# Patient Record
Sex: Male | Born: 1950 | Race: White | Hispanic: No | Marital: Married | Smoking: Former smoker
Health system: Southern US, Community
[De-identification: ages and names within clinical notes are randomized; demographics above are authoritative.]

## PROBLEM LIST (undated history)

## (undated) DIAGNOSIS — K5790 Diverticulosis of intestine, part unspecified, without perforation or abscess without bleeding: Secondary | ICD-10-CM

## (undated) DIAGNOSIS — M109 Gout, unspecified: Secondary | ICD-10-CM

## (undated) DIAGNOSIS — K219 Gastro-esophageal reflux disease without esophagitis: Secondary | ICD-10-CM

## (undated) DIAGNOSIS — E785 Hyperlipidemia, unspecified: Secondary | ICD-10-CM

## (undated) DIAGNOSIS — C61 Malignant neoplasm of prostate: Secondary | ICD-10-CM

## (undated) DIAGNOSIS — K279 Peptic ulcer, site unspecified, unspecified as acute or chronic, without hemorrhage or perforation: Secondary | ICD-10-CM

## (undated) DIAGNOSIS — J439 Emphysema, unspecified: Secondary | ICD-10-CM

## (undated) DIAGNOSIS — R7303 Prediabetes: Secondary | ICD-10-CM

## (undated) HISTORY — PX: TONSILLECTOMY: SUR1361

## (undated) HISTORY — PX: HERNIA REPAIR: SHX51

## (undated) HISTORY — PX: COLONOSCOPY: SHX174

---

## 2003-03-08 ENCOUNTER — Emergency Department (HOSPITAL_COMMUNITY): Admission: AD | Admit: 2003-03-08 | Discharge: 2003-03-08 | Payer: Self-pay | Admitting: Family Medicine

## 2006-02-17 DIAGNOSIS — K635 Polyp of colon: Secondary | ICD-10-CM

## 2006-02-17 HISTORY — DX: Polyp of colon: K63.5

## 2006-05-07 ENCOUNTER — Ambulatory Visit: Payer: Self-pay | Admitting: Gastroenterology

## 2009-07-13 ENCOUNTER — Ambulatory Visit: Payer: Self-pay | Admitting: Gastroenterology

## 2014-02-17 DIAGNOSIS — D126 Benign neoplasm of colon, unspecified: Secondary | ICD-10-CM

## 2014-02-17 HISTORY — DX: Benign neoplasm of colon, unspecified: D12.6

## 2014-08-07 ENCOUNTER — Ambulatory Visit (INDEPENDENT_AMBULATORY_CARE_PROVIDER_SITE_OTHER): Payer: BLUE CROSS/BLUE SHIELD | Admitting: Podiatry

## 2014-08-07 ENCOUNTER — Ambulatory Visit (INDEPENDENT_AMBULATORY_CARE_PROVIDER_SITE_OTHER): Payer: BLUE CROSS/BLUE SHIELD

## 2014-08-07 ENCOUNTER — Encounter: Payer: Self-pay | Admitting: Podiatry

## 2014-08-07 VITALS — BP 151/95 | HR 80 | Resp 16

## 2014-08-07 DIAGNOSIS — M722 Plantar fascial fibromatosis: Secondary | ICD-10-CM

## 2014-08-07 DIAGNOSIS — M79673 Pain in unspecified foot: Secondary | ICD-10-CM

## 2014-08-07 MED ORDER — MELOXICAM 15 MG PO TABS: 15.0000 mg | ORAL_TABLET | Freq: Every day | ORAL | Status: DC

## 2014-08-07 NOTE — Patient Instructions (Signed)

## 2014-08-07 NOTE — Progress Notes (Signed)
   Subjective:    Patient ID: David David, male    DOB: 10-10-1950, 64 y.o.   MRN: [id number]  HPI Comments: "My right heel is sore"  Patient c/o tender plantar right heel for 6 months. AM pain and painful when up in middle of the night. His wife has had some orthotics made by our office and recommended he get some as well. No treatment at home.     Review of Systems  All other systems reviewed and are negative.      Objective:   Physical Exam Physical Exam: I have reviewed her past history medications allergies surgery social history and review systems. Pulses are strongly palpable bilateral. Neurologic sensorium is intact for Semmes-Weinstein monofilament. Deep tendon reflexes are intact bilaterally muscle strength +5 over 5 dorsiflexors plantar flexors and inverters everters all Jones musculature is intact. Orthopedic evaluation demonstrates all joints distal to the ankle for range of motion without crepitation. Cutaneous evaluation demonstrates supple well-hydrated cutis. She has pain on palpation medial calcaneal tubercle of the right heel. Radiographs confirm soft tissue increase in density at the plantar fascial calcaneal insertion site.       Assessment & Plan:  Assessment: Plantar fasciitis right  Plan: Discussed etiology pathology conservative versus surgical therapies. Discussed the appropriate shoe gear stretching exercises ice therapy issue modifications. Dispensed a prescription for meloxicam. Scanned for custom molded orthotics today. I will follow-up with him in 1 month.

## 2014-09-06 ENCOUNTER — Ambulatory Visit: Payer: BLUE CROSS/BLUE SHIELD | Admitting: Podiatry

## 2014-09-11 ENCOUNTER — Ambulatory Visit (INDEPENDENT_AMBULATORY_CARE_PROVIDER_SITE_OTHER): Payer: BLUE CROSS/BLUE SHIELD | Admitting: Podiatry

## 2014-09-11 DIAGNOSIS — M722 Plantar fascial fibromatosis: Secondary | ICD-10-CM

## 2014-09-11 NOTE — Progress Notes (Signed)
He presents today for follow-up of his plantar fasciitis. He states that his heels are doing 100% better. He is also here to pick up his orthotics today.  Orthotics were dispensed he was divided with both oral and written home-going instructions for the care and use of the orthotics we will follow-up with him in 1 month.

## 2014-09-11 NOTE — Patient Instructions (Signed)

## 2015-02-18 HISTORY — PX: OTHER SURGICAL HISTORY: SHX169

## 2015-07-10 ENCOUNTER — Emergency Department
Admission: EM | Admit: 2015-07-10 | Discharge: 2015-07-10 | Disposition: A | Discharge status: Home / Self Care | Payer: Managed Care, Other (non HMO) | Attending: Emergency Medicine | Admitting: Emergency Medicine

## 2015-07-10 DIAGNOSIS — Z5321 Procedure and treatment not carried out due to patient leaving prior to being seen by health care provider: Secondary | ICD-10-CM | POA: Insufficient documentation

## 2015-07-10 DIAGNOSIS — Z791 Long term (current) use of non-steroidal anti-inflammatories (NSAID): Secondary | ICD-10-CM | POA: Diagnosis not present

## 2015-07-10 DIAGNOSIS — R339 Retention of urine, unspecified: Secondary | ICD-10-CM | POA: Diagnosis not present

## 2015-07-10 NOTE — ED Notes (Signed)
Pt states he had a prostate biopsy today and has not been able to urinate since 1:45.Marland Kitchen

## 2015-07-10 NOTE — ED Notes (Signed)
Bladder scan 12 cc

## 2015-09-18 DIAGNOSIS — C61 Malignant neoplasm of prostate: Secondary | ICD-10-CM

## 2015-09-18 HISTORY — DX: Malignant neoplasm of prostate: C61

## 2015-12-10 ENCOUNTER — Ambulatory Visit: Payer: Managed Care, Other (non HMO) | Admitting: Physical Therapy

## 2015-12-19 ENCOUNTER — Encounter: Payer: Managed Care, Other (non HMO) | Admitting: Physical Therapy

## 2015-12-26 ENCOUNTER — Encounter: Payer: Managed Care, Other (non HMO) | Admitting: Physical Therapy

## 2016-01-02 ENCOUNTER — Encounter: Payer: Managed Care, Other (non HMO) | Admitting: Physical Therapy

## 2016-01-16 ENCOUNTER — Encounter: Payer: Managed Care, Other (non HMO) | Admitting: Physical Therapy

## 2016-01-30 ENCOUNTER — Encounter: Payer: Managed Care, Other (non HMO) | Admitting: Physical Therapy

## 2016-02-13 ENCOUNTER — Encounter: Payer: Managed Care, Other (non HMO) | Admitting: Physical Therapy

## 2016-07-02 ENCOUNTER — Other Ambulatory Visit: Payer: Self-pay | Admitting: Family Medicine

## 2016-07-02 DIAGNOSIS — Z87891 Personal history of nicotine dependence: Secondary | ICD-10-CM

## 2016-07-11 ENCOUNTER — Ambulatory Visit
Admission: RE | Admit: 2016-07-11 | Discharge: 2016-07-11 | Disposition: A | Discharge status: Home / Self Care | Payer: 59 | Source: Ambulatory Visit | Attending: Family Medicine | Admitting: Family Medicine

## 2016-07-11 DIAGNOSIS — M799 Soft tissue disorder, unspecified: Secondary | ICD-10-CM | POA: Insufficient documentation

## 2016-07-11 DIAGNOSIS — Z87891 Personal history of nicotine dependence: Secondary | ICD-10-CM | POA: Insufficient documentation

## 2016-07-17 ENCOUNTER — Other Ambulatory Visit: Payer: Self-pay | Admitting: Family Medicine

## 2016-07-17 DIAGNOSIS — R229 Localized swelling, mass and lump, unspecified: Principal | ICD-10-CM

## 2016-07-17 DIAGNOSIS — IMO0002 Reserved for concepts with insufficient information to code with codable children: Secondary | ICD-10-CM

## 2016-07-22 ENCOUNTER — Ambulatory Visit
Admission: RE | Admit: 2016-07-22 | Discharge: 2016-07-22 | Disposition: A | Discharge status: Home / Self Care | Payer: 59 | Source: Ambulatory Visit | Attending: Family Medicine | Admitting: Family Medicine

## 2016-07-22 DIAGNOSIS — K760 Fatty (change of) liver, not elsewhere classified: Secondary | ICD-10-CM | POA: Diagnosis not present

## 2016-07-22 DIAGNOSIS — N200 Calculus of kidney: Secondary | ICD-10-CM | POA: Insufficient documentation

## 2016-07-22 DIAGNOSIS — K5730 Diverticulosis of large intestine without perforation or abscess without bleeding: Secondary | ICD-10-CM | POA: Diagnosis not present

## 2016-07-22 DIAGNOSIS — R229 Localized swelling, mass and lump, unspecified: Secondary | ICD-10-CM | POA: Diagnosis present

## 2016-07-22 DIAGNOSIS — IMO0002 Reserved for concepts with insufficient information to code with codable children: Secondary | ICD-10-CM

## 2016-07-22 HISTORY — DX: Malignant neoplasm of prostate: C61

## 2016-07-22 MED ORDER — IOPAMIDOL (ISOVUE-300) INJECTION 61%
100.0000 mL | Freq: Once | INTRAVENOUS | Status: AC | PRN
  Administered 2016-07-22: 100 mL via INTRAVENOUS

## 2016-07-25 ENCOUNTER — Ambulatory Visit: Payer: Managed Care, Other (non HMO)

## 2016-10-23 DIAGNOSIS — C61 Malignant neoplasm of prostate: Secondary | ICD-10-CM | POA: Diagnosis not present

## 2016-12-03 DIAGNOSIS — H903 Sensorineural hearing loss, bilateral: Secondary | ICD-10-CM | POA: Diagnosis not present

## 2016-12-31 DIAGNOSIS — E785 Hyperlipidemia, unspecified: Secondary | ICD-10-CM | POA: Diagnosis not present

## 2016-12-31 DIAGNOSIS — R7303 Prediabetes: Secondary | ICD-10-CM | POA: Diagnosis not present

## 2017-01-07 DIAGNOSIS — K219 Gastro-esophageal reflux disease without esophagitis: Secondary | ICD-10-CM | POA: Diagnosis not present

## 2017-01-07 DIAGNOSIS — E785 Hyperlipidemia, unspecified: Secondary | ICD-10-CM | POA: Diagnosis not present

## 2017-01-07 DIAGNOSIS — R7303 Prediabetes: Secondary | ICD-10-CM | POA: Diagnosis not present

## 2017-01-29 ENCOUNTER — Other Ambulatory Visit: Payer: Self-pay

## 2017-01-29 DIAGNOSIS — C61 Malignant neoplasm of prostate: Secondary | ICD-10-CM

## 2017-02-11 ENCOUNTER — Telehealth: Payer: Self-pay | Admitting: Urology

## 2017-02-11 NOTE — Telephone Encounter (Signed)
Pt needs a refill on Sildenafil.  He would like this sent to [facility].  He is a pt of [facility] from [facility].  [phone]

## 2017-02-12 NOTE — Telephone Encounter (Signed)
Did not see in [facility] note when pt was supposed to return. Do you want pt to have refills?

## 2017-02-13 MED ORDER — SILDENAFIL CITRATE 20 MG PO TABS: ORAL_TABLET | ORAL | 0 refills | Status: DC

## 2017-02-13 NOTE — Telephone Encounter (Signed)
Lab appt and OV appts made.

## 2017-02-13 NOTE — Telephone Encounter (Signed)
He is past due for a psa- f/u based on psa results.  rx sent

## 2017-02-18 ENCOUNTER — Telehealth: Payer: Self-pay

## 2017-02-18 NOTE — Telephone Encounter (Signed)
David Mccoy from [facility] called stating pt has been getting sildenafil 20mg  90 tabs with directions of 1 tab daily. The most recent rx sent over was 10 tabs directions take 2-5 prior to intercourse. Which rx would you prefer pt to have? F/u appts have been made.

## 2017-02-19 MED ORDER — SILDENAFIL CITRATE 20 MG PO TABS: ORAL_TABLET | ORAL | 0 refills | Status: DC

## 2017-02-19 NOTE — Telephone Encounter (Signed)
Spoke with pt in reference to medication. Pt voiced understanding. 90 and no refills sent to pharmacy.

## 2017-02-19 NOTE — Telephone Encounter (Signed)
90 with no refills is fine

## 2017-03-18 ENCOUNTER — Other Ambulatory Visit: Payer: PPO

## 2017-03-18 DIAGNOSIS — C61 Malignant neoplasm of prostate: Secondary | ICD-10-CM

## 2017-03-19 LAB — PSA: Prostate Specific Ag, Serum: <0.1 ng/mL (ref 0.0–4.0)

## 2017-03-20 ENCOUNTER — Telehealth: Payer: Self-pay

## 2017-03-20 NOTE — Telephone Encounter (Signed)
-----   Message from Abbie Sons, MD sent at 03/20/2017  8:01 AM EST ----- PSA remains undetectable at <0.1

## 2017-03-20 NOTE — Telephone Encounter (Signed)
Letter sent.

## 2017-03-25 ENCOUNTER — Ambulatory Visit (INDEPENDENT_AMBULATORY_CARE_PROVIDER_SITE_OTHER): Payer: PPO | Admitting: Urology

## 2017-03-25 ENCOUNTER — Encounter: Payer: Self-pay | Admitting: Urology

## 2017-03-25 VITALS — BP 159/98 | HR 81 | Ht 69.0 in | Wt 187.8 lb

## 2017-03-25 DIAGNOSIS — C61 Malignant neoplasm of prostate: Secondary | ICD-10-CM | POA: Insufficient documentation

## 2017-03-25 DIAGNOSIS — N393 Stress incontinence (female) (male): Secondary | ICD-10-CM | POA: Insufficient documentation

## 2017-03-25 DIAGNOSIS — N5231 Erectile dysfunction following radical prostatectomy: Secondary | ICD-10-CM | POA: Diagnosis not present

## 2017-03-25 NOTE — Progress Notes (Signed)
03/25/2017 10:09 AM   David Mccoy February 23, 1950 [id number]  Referring provider: Dion Body, MD [facility] [place],  [id number]  Chief Complaint  Patient presents with  . Prostate Cancer    HPI: 67 year old male presents for follow-up of prostate cancer.  He is status post RALP August 2017 for Gleason 3+4 adenocarcinoma.  Final pathology showed pT2c N0 Mx adenocarcinoma with a positive unifocal apical margin.  Options of adjuvant versus salvage radiotherapy were discussed and he elected the latter.  I last saw him at [facility] in March 2018.  A PSA drawn on 03/18/2017 remains undetectable at <0.1.  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  He has tried PDE 5 inhibitors, intracavernosal injections and vacuum erection device.  He states the intracavernosal injections were very painful and unable to tolerate.  He has noted some upward curvature to his penis.  He has mild stress incontinence and changes a pad 1 time per day.  He did undergo the pelvic floor PT however has stopped doing exercises.    PMH: Past Medical History:  Diagnosis Date  . Prostate CA [facility]) 09/2015    Surgical History: Past Surgical History:  Procedure Laterality Date  . TONSILLECTOMY      Home Medications:  Allergies as of 03/25/2017   No Known Allergies     Medication List        Accurate as of 03/25/17 10:09 AM. Always use your most recent med list.          CALCIUM 1000 + D PO Take by mouth.   cholecalciferol 1000 units tablet Commonly known as:  VITAMIN D Take 1,000 Units by mouth daily.   lansoprazole 15 MG capsule Commonly known as:  PREVACID Take 15 mg by mouth daily at 12 noon.   multivitamin tablet Take 1 tablet by mouth daily.   omega-3 acid ethyl esters 1 g capsule Commonly known as:  LOVAZA Take by mouth 2 (two) times daily.   sildenafil 20 MG tablet Commonly known as:  REVATIO One tablet daily   simvastatin 20 MG  tablet Commonly known as:  ZOCOR Take 20 mg by mouth daily.       Allergies: No Known Allergies  Family History: Family History  Problem Relation Age of Onset  . Bladder Cancer Neg Hx   . Kidney cancer Neg Hx   . Prostate cancer Neg Hx     Social History:  reports that  has never smoked. He has quit using smokeless tobacco. He reports that he drinks alcohol. He reports that he does not use drugs.  ROS: UROLOGY Frequent Urination?: Yes Hard to postpone urination?: No Burning/pain with urination?: No Get up at night to urinate?: No Leakage of urine?: Yes Urine stream starts and stops?: No Trouble starting stream?: No Do you have to strain to urinate?: No Blood in urine?: No Urinary tract infection?: No Sexually transmitted disease?: No Injury to kidneys or bladder?: No Painful intercourse?: No Weak stream?: No Erection problems?: No Penile pain?: No  Gastrointestinal Nausea?: No Vomiting?: No Indigestion/heartburn?: No Diarrhea?: No Constipation?: No  Constitutional Fever: No Night sweats?: No Weight loss?: No Fatigue?: No  Skin Skin rash/lesions?: No Itching?: No  Eyes Blurred vision?: No Double vision?: No  Ears/Nose/Throat Sore throat?: No Sinus problems?: No  Hematologic/Lymphatic Swollen glands?: No Easy bruising?: No  Cardiovascular Leg swelling?: No Chest pain?: No  Respiratory Cough?: No Shortness of breath?: No  Endocrine Excessive thirst?: No  Musculoskeletal Back pain?:  No Joint pain?: No  Neurological Headaches?: No Dizziness?: No  Psychologic Depression?: No Anxiety?: No  Physical Exam: BP (!) 159/98 (BP Location: Right Arm, Patient Position: Sitting, Cuff Size: Normal)   Pulse 81   Ht 5\' 9"  (1.753 m)   Wt 187 lb 12.8 oz (85.2 kg)   BMI 27.73 kg/m   Constitutional:  Alert and oriented, No acute distress. HEENT: [place] AT, moist mucus membranes.  Trachea midline, no masses. Cardiovascular: No clubbing, cyanosis,  or edema. Respiratory: Normal respiratory effort, no increased work of breathing. Neurologic: Grossly intact, no focal deficits, moving all 4 extremities. Psychiatric: Normal mood and affect.  Laboratory Data:  Lab Results  Component Value Date   PSA1 <0.1 03/18/2017     Assessment & Plan: pT2c adenocarcinoma.  His PSA remains undetectable.  Discussed penile prosthetic surgery for his ED.  He would like to think this over and if he desires to pursue will call back.  He will restart his pelvic floor exercises for stress incontinence.  Recommend a follow-up PSA in 6 months and an office visit/PSA 1 year.    Abbie Sons, [place] [address] [place], [place] [address] [phone]

## 2017-06-25 DIAGNOSIS — E785 Hyperlipidemia, unspecified: Secondary | ICD-10-CM | POA: Diagnosis not present

## 2017-06-25 DIAGNOSIS — K219 Gastro-esophageal reflux disease without esophagitis: Secondary | ICD-10-CM | POA: Diagnosis not present

## 2017-06-25 DIAGNOSIS — R7303 Prediabetes: Secondary | ICD-10-CM | POA: Diagnosis not present

## 2017-07-09 DIAGNOSIS — E785 Hyperlipidemia, unspecified: Secondary | ICD-10-CM | POA: Diagnosis not present

## 2017-07-09 DIAGNOSIS — R7303 Prediabetes: Secondary | ICD-10-CM | POA: Diagnosis not present

## 2017-07-09 DIAGNOSIS — Z0000 Encounter for general adult medical examination without abnormal findings: Secondary | ICD-10-CM | POA: Diagnosis not present

## 2017-09-30 ENCOUNTER — Other Ambulatory Visit: Payer: PPO

## 2017-09-30 DIAGNOSIS — C61 Malignant neoplasm of prostate: Secondary | ICD-10-CM

## 2017-10-01 LAB — PSA: Prostate Specific Ag, Serum: <0.1 ng/mL (ref 0.0–4.0)

## 2017-12-07 DIAGNOSIS — L718 Other rosacea: Secondary | ICD-10-CM | POA: Diagnosis not present

## 2017-12-07 DIAGNOSIS — L218 Other seborrheic dermatitis: Secondary | ICD-10-CM | POA: Diagnosis not present

## 2017-12-07 DIAGNOSIS — D2262 Melanocytic nevi of left upper limb, including shoulder: Secondary | ICD-10-CM | POA: Diagnosis not present

## 2017-12-07 DIAGNOSIS — D2261 Melanocytic nevi of right upper limb, including shoulder: Secondary | ICD-10-CM | POA: Diagnosis not present

## 2017-12-07 DIAGNOSIS — L821 Other seborrheic keratosis: Secondary | ICD-10-CM | POA: Diagnosis not present

## 2017-12-07 DIAGNOSIS — D2272 Melanocytic nevi of left lower limb, including hip: Secondary | ICD-10-CM | POA: Diagnosis not present

## 2017-12-07 DIAGNOSIS — D2271 Melanocytic nevi of right lower limb, including hip: Secondary | ICD-10-CM | POA: Diagnosis not present

## 2017-12-07 DIAGNOSIS — D225 Melanocytic nevi of trunk: Secondary | ICD-10-CM | POA: Diagnosis not present

## 2018-01-13 DIAGNOSIS — E785 Hyperlipidemia, unspecified: Secondary | ICD-10-CM | POA: Diagnosis not present

## 2018-01-13 DIAGNOSIS — R7303 Prediabetes: Secondary | ICD-10-CM | POA: Diagnosis not present

## 2018-01-20 DIAGNOSIS — K219 Gastro-esophageal reflux disease without esophagitis: Secondary | ICD-10-CM | POA: Diagnosis not present

## 2018-01-20 DIAGNOSIS — E782 Mixed hyperlipidemia: Secondary | ICD-10-CM | POA: Diagnosis not present

## 2018-01-20 DIAGNOSIS — R7303 Prediabetes: Secondary | ICD-10-CM | POA: Diagnosis not present

## 2018-03-23 ENCOUNTER — Other Ambulatory Visit: Payer: Self-pay

## 2018-03-23 DIAGNOSIS — C61 Malignant neoplasm of prostate: Secondary | ICD-10-CM

## 2018-03-24 ENCOUNTER — Other Ambulatory Visit: Payer: PPO

## 2018-03-24 DIAGNOSIS — C61 Malignant neoplasm of prostate: Secondary | ICD-10-CM | POA: Diagnosis not present

## 2018-03-25 LAB — PSA: Prostate Specific Ag, Serum: <0.1 ng/mL (ref 0.0–4.0)

## 2018-03-31 ENCOUNTER — Encounter: Payer: Self-pay | Admitting: Urology

## 2018-03-31 ENCOUNTER — Ambulatory Visit: Payer: PPO | Admitting: Urology

## 2018-03-31 VITALS — BP 146/82 | HR 88 | Ht 68.0 in | Wt 180.0 lb

## 2018-03-31 DIAGNOSIS — C61 Malignant neoplasm of prostate: Secondary | ICD-10-CM

## 2018-03-31 MED ORDER — MIRABEGRON ER 50 MG PO TB24: 50.0000 mg | ORAL_TABLET | Freq: Every day | ORAL | 11 refills | Status: DC

## 2018-03-31 NOTE — Progress Notes (Signed)
03/31/2018 9:45 AM   David Mccoy [birth year] [id number]  Referring provider: Dion Body, MD [facility] [place], [place] [address]  Chief Complaint  Patient presents with  . Follow-up   Urologic history: 1. pT2c N0 M0 adenocarcinoma prostate  - RALP at [facility] 09/2015  -Gleason 3+4 with positive unifocal apical margin  -Declined adjuvant radiation  -PSA has remained undetectable   HPI: 68 year old male presents for annual follow-up prostate cancer.  He does have urinary frequency which has been present since his surgery.  He did undergo pelvic floor physical therapy which did not improve his symptoms.  Denies urinary incontinence, dysuria or gross hematuria.  PSA 03/24/2018 remains undetectable at <0.1.   PMH: Past Medical History:  Diagnosis Date  . Prostate CA [facility]) 09/2015    Surgical History: Past Surgical History:  Procedure Laterality Date  . TONSILLECTOMY      Home Medications:  Allergies as of 03/31/2018   No Known Allergies     Medication List       Accurate as of March 31, 2018  9:45 AM. Always use your most recent med list.        CALCIUM 1000 + D PO Take by mouth.   cholecalciferol 1000 units tablet Commonly known as:  VITAMIN D Take 1,000 Units by mouth daily.   lansoprazole 15 MG capsule Commonly known as:  PREVACID Take 15 mg by mouth daily at 12 noon.   multivitamin tablet Take 1 tablet by mouth daily.   omega-3 acid ethyl esters 1 g capsule Commonly known as:  LOVAZA Take by mouth 2 (two) times daily.   sildenafil 20 MG tablet Commonly known as:  REVATIO One tablet daily   simvastatin 20 MG tablet Commonly known as:  ZOCOR Take 20 mg by mouth daily.       Allergies: No Known Allergies  Family History: Family History  Problem Relation Age of Onset  . Bladder Cancer Neg Hx   . Kidney cancer Neg Hx   . Prostate cancer Neg Hx     Social History:  reports that he has never  smoked. He has quit using smokeless tobacco. He reports current alcohol use. He reports that he does not use drugs.  ROS: UROLOGY Frequent Urination?: Yes Hard to postpone urination?: No Burning/pain with urination?: No Get up at night to urinate?: Yes Leakage of urine?: Yes Urine stream starts and stops?: No Trouble starting stream?: No Blood in urine?: No Urinary tract infection?: No Sexually transmitted disease?: No Injury to kidneys or bladder?: No Painful intercourse?: No Weak stream?: No Erection problems?: Yes Penile pain?: No  Gastrointestinal Nausea?: No Vomiting?: No Indigestion/heartburn?: No Diarrhea?: No Constipation?: No  Constitutional Fever: No Night sweats?: No Weight loss?: No Fatigue?: No  Skin Skin rash/lesions?: No Itching?: No  Eyes Blurred vision?: No Double vision?: No  Ears/Nose/Throat Sore throat?: No Sinus problems?: No  Hematologic/Lymphatic Swollen glands?: No Easy bruising?: No  Cardiovascular Leg swelling?: No Chest pain?: No  Respiratory Cough?: No Shortness of breath?: No  Endocrine Excessive thirst?: No  Musculoskeletal Back pain?: No Joint pain?: No  Neurological Headaches?: No Dizziness?: No  Psychologic Depression?: No Anxiety?: No  Physical Exam: BP (!) 146/82   Pulse 88   Ht 5\' 8"  (1.727 m)   Wt 180 lb (81.6 kg)   BMI 27.37 kg/m   Constitutional:  Alert and oriented, No acute distress. HEENT: [place] AT, moist mucus membranes.  Trachea midline, no masses. Cardiovascular:  No clubbing, cyanosis, or edema. Respiratory: Normal respiratory effort, no increased work of breathing. GI: Abdomen is soft, nontender, nondistended, no abdominal masses GU: No CVA tenderness Lymph: No cervical or inguinal lymphadenopathy. Skin: No rashes, bruises or suspicious lesions. Neurologic: Grossly intact, no focal deficits, moving all 4 extremities. Psychiatric: Normal mood and affect.  Assessment & Plan:     68 year old male status post RALP for prostate cancer.  Currently doing well and PSA remains undetectable.  His urinary frequency is bothersome and he was interested in a trial of medical management.  He was given Myrbetriq samples 50 mg and an Rx was sent to his pharmacy in the event it needs prior authorization.  Follow-up 6 months lab visit/PSA and 1 year office visit/PSA.  Return in about 1 year (around 04/01/2019) for Recheck, PSA.   Abbie Sons, [place] [address] [place], [place] [address] [phone]

## 2018-04-01 ENCOUNTER — Encounter: Payer: Self-pay | Admitting: Urology

## 2018-04-08 ENCOUNTER — Encounter: Payer: Self-pay | Admitting: Urology

## 2018-04-22 ENCOUNTER — Other Ambulatory Visit: Payer: Self-pay

## 2018-04-22 ENCOUNTER — Encounter: Payer: Self-pay | Admitting: Urology

## 2018-04-22 DIAGNOSIS — N393 Stress incontinence (female) (male): Secondary | ICD-10-CM

## 2018-04-22 MED ORDER — MIRABEGRON ER 50 MG PO TB24: 50.0000 mg | ORAL_TABLET | Freq: Every day | ORAL | 11 refills | Status: DC

## 2018-07-21 DIAGNOSIS — R7303 Prediabetes: Secondary | ICD-10-CM | POA: Diagnosis not present

## 2018-07-21 DIAGNOSIS — E782 Mixed hyperlipidemia: Secondary | ICD-10-CM | POA: Diagnosis not present

## 2018-07-28 DIAGNOSIS — R7303 Prediabetes: Secondary | ICD-10-CM | POA: Diagnosis not present

## 2018-07-28 DIAGNOSIS — E782 Mixed hyperlipidemia: Secondary | ICD-10-CM | POA: Diagnosis not present

## 2018-07-28 DIAGNOSIS — Z0000 Encounter for general adult medical examination without abnormal findings: Secondary | ICD-10-CM | POA: Diagnosis not present

## 2018-08-12 DIAGNOSIS — R7303 Prediabetes: Secondary | ICD-10-CM | POA: Diagnosis not present

## 2018-08-12 DIAGNOSIS — S86112A Strain of other muscle(s) and tendon(s) of posterior muscle group at lower leg level, left leg, initial encounter: Secondary | ICD-10-CM | POA: Diagnosis not present

## 2018-09-02 DIAGNOSIS — K1329 Other disturbances of oral epithelium, including tongue: Secondary | ICD-10-CM | POA: Diagnosis not present

## 2018-09-13 DIAGNOSIS — K1329 Other disturbances of oral epithelium, including tongue: Secondary | ICD-10-CM | POA: Diagnosis not present

## 2018-09-28 ENCOUNTER — Other Ambulatory Visit: Payer: Self-pay | Admitting: Family Medicine

## 2018-09-28 DIAGNOSIS — C61 Malignant neoplasm of prostate: Secondary | ICD-10-CM

## 2018-09-29 ENCOUNTER — Other Ambulatory Visit: Payer: Self-pay

## 2018-09-29 ENCOUNTER — Other Ambulatory Visit: Payer: PPO

## 2018-09-29 DIAGNOSIS — C61 Malignant neoplasm of prostate: Secondary | ICD-10-CM | POA: Diagnosis not present

## 2018-09-30 LAB — PSA: Prostate Specific Ag, Serum: <0.1 ng/mL (ref 0.0–4.0)

## 2018-10-05 ENCOUNTER — Telehealth: Payer: Self-pay

## 2018-10-05 NOTE — Telephone Encounter (Signed)
-----   Message from Abbie Sons, MD sent at 10/04/2018  3:21 PM EDT ----- PSA remains undetectable at <0.1.  Follow-up as scheduled

## 2018-10-05 NOTE — Telephone Encounter (Signed)
mychart sent.

## 2018-11-12 ENCOUNTER — Encounter: Payer: Self-pay | Admitting: Urology

## 2018-11-17 ENCOUNTER — Encounter: Payer: Self-pay | Admitting: Urology

## 2018-12-06 DIAGNOSIS — L218 Other seborrheic dermatitis: Secondary | ICD-10-CM | POA: Diagnosis not present

## 2018-12-06 DIAGNOSIS — D2261 Melanocytic nevi of right upper limb, including shoulder: Secondary | ICD-10-CM | POA: Diagnosis not present

## 2018-12-06 DIAGNOSIS — D2262 Melanocytic nevi of left upper limb, including shoulder: Secondary | ICD-10-CM | POA: Diagnosis not present

## 2018-12-06 DIAGNOSIS — L570 Actinic keratosis: Secondary | ICD-10-CM | POA: Diagnosis not present

## 2018-12-06 DIAGNOSIS — L821 Other seborrheic keratosis: Secondary | ICD-10-CM | POA: Diagnosis not present

## 2018-12-06 DIAGNOSIS — X32XXXA Exposure to sunlight, initial encounter: Secondary | ICD-10-CM | POA: Diagnosis not present

## 2018-12-06 DIAGNOSIS — D2271 Melanocytic nevi of right lower limb, including hip: Secondary | ICD-10-CM | POA: Diagnosis not present

## 2018-12-06 DIAGNOSIS — D2272 Melanocytic nevi of left lower limb, including hip: Secondary | ICD-10-CM | POA: Diagnosis not present

## 2018-12-06 DIAGNOSIS — D225 Melanocytic nevi of trunk: Secondary | ICD-10-CM | POA: Diagnosis not present

## 2019-01-25 DIAGNOSIS — E782 Mixed hyperlipidemia: Secondary | ICD-10-CM | POA: Diagnosis not present

## 2019-01-25 DIAGNOSIS — R7303 Prediabetes: Secondary | ICD-10-CM | POA: Diagnosis not present

## 2019-02-01 DIAGNOSIS — E782 Mixed hyperlipidemia: Secondary | ICD-10-CM | POA: Diagnosis not present

## 2019-02-01 DIAGNOSIS — R7303 Prediabetes: Secondary | ICD-10-CM | POA: Diagnosis not present

## 2019-02-01 DIAGNOSIS — R030 Elevated blood-pressure reading, without diagnosis of hypertension: Secondary | ICD-10-CM | POA: Diagnosis not present

## 2019-02-01 DIAGNOSIS — K219 Gastro-esophageal reflux disease without esophagitis: Secondary | ICD-10-CM | POA: Diagnosis not present

## 2019-03-21 ENCOUNTER — Other Ambulatory Visit: Payer: Self-pay

## 2019-03-21 DIAGNOSIS — C61 Malignant neoplasm of prostate: Secondary | ICD-10-CM

## 2019-03-22 ENCOUNTER — Other Ambulatory Visit: Payer: PPO

## 2019-03-22 ENCOUNTER — Other Ambulatory Visit: Payer: Self-pay

## 2019-03-22 DIAGNOSIS — C61 Malignant neoplasm of prostate: Secondary | ICD-10-CM

## 2019-03-23 ENCOUNTER — Encounter: Payer: Self-pay | Admitting: Urology

## 2019-03-23 LAB — PSA: Prostate Specific Ag, Serum: <0.1 ng/mL (ref 0.0–4.0)

## 2019-03-29 ENCOUNTER — Ambulatory Visit: Payer: PPO | Admitting: Urology

## 2019-03-30 ENCOUNTER — Ambulatory Visit: Payer: PPO | Admitting: Urology

## 2019-04-01 ENCOUNTER — Ambulatory Visit: Payer: 59

## 2019-07-26 DIAGNOSIS — K219 Gastro-esophageal reflux disease without esophagitis: Secondary | ICD-10-CM | POA: Diagnosis not present

## 2019-07-26 DIAGNOSIS — E782 Mixed hyperlipidemia: Secondary | ICD-10-CM | POA: Diagnosis not present

## 2019-07-26 DIAGNOSIS — R7303 Prediabetes: Secondary | ICD-10-CM | POA: Diagnosis not present

## 2019-08-02 DIAGNOSIS — E782 Mixed hyperlipidemia: Secondary | ICD-10-CM | POA: Diagnosis not present

## 2019-08-02 DIAGNOSIS — R7303 Prediabetes: Secondary | ICD-10-CM | POA: Diagnosis not present

## 2019-08-02 DIAGNOSIS — Z0000 Encounter for general adult medical examination without abnormal findings: Secondary | ICD-10-CM | POA: Diagnosis not present

## 2019-08-02 DIAGNOSIS — Z1159 Encounter for screening for other viral diseases: Secondary | ICD-10-CM | POA: Diagnosis not present

## 2019-08-05 ENCOUNTER — Encounter: Payer: Self-pay | Admitting: *Deleted

## 2019-08-05 ENCOUNTER — Telehealth: Payer: Self-pay | Admitting: *Deleted

## 2019-08-05 DIAGNOSIS — Z87891 Personal history of nicotine dependence: Secondary | ICD-10-CM

## 2019-08-05 DIAGNOSIS — Z122 Encounter for screening for malignant neoplasm of respiratory organs: Secondary | ICD-10-CM

## 2019-08-05 NOTE — Addendum Note (Signed)
Addended by: Lieutenant Diego on: 08/05/2019 02:53 PM   Modules accepted: Orders

## 2019-08-05 NOTE — Telephone Encounter (Signed)
Received referral for low dose lung cancer screening CT scan. Message left at phone number listed in EMR for patient to call me back to facilitate scheduling scan.  

## 2019-08-05 NOTE — Telephone Encounter (Signed)
Received referral for initial lung cancer screening scan. Contacted patient and obtained smoking history,(former, quit 2008, 37 pack year) as well as answering questions related to screening process. Patient denies signs of lung cancer such as weight loss or hemoptysis. Patient denies comorbidity that would prevent curative treatment if lung cancer were found. Patient is scheduled for CT 08/15/19 at 8am.

## 2019-08-12 ENCOUNTER — Encounter: Payer: Self-pay | Admitting: Oncology

## 2019-08-12 ENCOUNTER — Inpatient Hospital Stay (HOSPITAL_BASED_OUTPATIENT_CLINIC_OR_DEPARTMENT_OTHER): Payer: PPO | Admitting: Oncology

## 2019-08-12 DIAGNOSIS — Z87891 Personal history of nicotine dependence: Secondary | ICD-10-CM | POA: Diagnosis not present

## 2019-08-12 NOTE — Progress Notes (Signed)
Virtual Visit via Video Note  I connected with David Mccoy  on 08/12/19 at  8:30 AM EDT by a video enabled telemedicine application and verified that I am speaking with the correct person using two identifiers.  Location: Patient: Home Provider: Clinic    I discussed the limitations of evaluation and management by telemedicine and the availability of in person appointments. The patient expressed understanding and agreed to proceed.  I discussed the assessment and treatment plan with the patient. The patient was provided an opportunity to ask questions and all were answered. The patient agreed with the plan and demonstrated an understanding of the instructions.   The patient was advised to call back or seek an in-person evaluation if the symptoms worsen or if the condition fails to improve as anticipated.   In accordance with CMS guidelines, patient has met eligibility criteria including age, absence of signs or symptoms of lung cancer.  Social History   Tobacco Use  . Smoking status: Never Smoker  . Smokeless tobacco: Former Network engineer  . Vaping Use: Never used  Substance Use Topics  . Alcohol use: Yes    Alcohol/week: 0.0 standard drinks  . Drug use: No      A shared decision-making session was conducted prior to the performance of CT scan. This includes one or more decision aids, includes benefits and harms of screening, follow-up diagnostic testing, over-diagnosis, false positive rate, and total radiation exposure.   Counseling on the importance of adherence to annual lung cancer LDCT screening, impact of co-morbidities, and ability or willingness to undergo diagnosis and treatment is imperative for compliance of the program.   Counseling on the importance of continued smoking cessation for former smokers; the importance of smoking cessation for current smokers, and information about tobacco cessation interventions have been given to patient including [place]  and 1800 quit [place] programs.   Written order for lung cancer screening with LDCT has been given to the patient and any and all questions have been answered to the best of my abilities.    Yearly follow up will be coordinated by Burgess Estelle, Thoracic Navigator.  I provided 15 minutes of face-to-face video visit time during this encounter, and > 50% was spent counseling as documented under my assessment & plan.   Jacquelin Hawking, NP

## 2019-08-15 ENCOUNTER — Other Ambulatory Visit: Payer: Self-pay

## 2019-08-15 ENCOUNTER — Ambulatory Visit
Admission: RE | Admit: 2019-08-15 | Discharge: 2019-08-15 | Disposition: A | Discharge status: Home / Self Care | Payer: PPO | Source: Ambulatory Visit | Attending: Oncology | Admitting: Oncology

## 2019-08-15 DIAGNOSIS — Z122 Encounter for screening for malignant neoplasm of respiratory organs: Secondary | ICD-10-CM | POA: Diagnosis not present

## 2019-08-15 DIAGNOSIS — Z87891 Personal history of nicotine dependence: Secondary | ICD-10-CM | POA: Diagnosis not present

## 2019-08-18 ENCOUNTER — Encounter: Payer: Self-pay | Admitting: *Deleted

## 2019-08-23 DIAGNOSIS — I700 Atherosclerosis of aorta: Secondary | ICD-10-CM | POA: Diagnosis not present

## 2019-08-23 DIAGNOSIS — J438 Other emphysema: Secondary | ICD-10-CM | POA: Diagnosis not present

## 2019-09-22 ENCOUNTER — Ambulatory Visit: Payer: PPO | Admitting: Urology

## 2019-10-03 ENCOUNTER — Other Ambulatory Visit: Payer: Self-pay

## 2019-10-03 DIAGNOSIS — C61 Malignant neoplasm of prostate: Secondary | ICD-10-CM

## 2019-10-04 ENCOUNTER — Other Ambulatory Visit: Payer: Self-pay

## 2019-10-04 ENCOUNTER — Other Ambulatory Visit: Payer: PPO

## 2019-10-04 DIAGNOSIS — C61 Malignant neoplasm of prostate: Secondary | ICD-10-CM | POA: Diagnosis not present

## 2019-10-05 LAB — PSA: Prostate Specific Ag, Serum: <0.1 ng/mL (ref 0.0–4.0)

## 2019-10-06 ENCOUNTER — Ambulatory Visit: Payer: PPO | Admitting: Urology

## 2019-10-06 ENCOUNTER — Encounter: Payer: Self-pay | Admitting: Urology

## 2019-10-06 ENCOUNTER — Other Ambulatory Visit: Payer: Self-pay

## 2019-10-06 VITALS — BP 135/86 | HR 71 | Ht 68.0 in | Wt 180.0 lb

## 2019-10-06 DIAGNOSIS — R399 Unspecified symptoms and signs involving the genitourinary system: Secondary | ICD-10-CM | POA: Diagnosis not present

## 2019-10-06 DIAGNOSIS — C61 Malignant neoplasm of prostate: Secondary | ICD-10-CM

## 2019-10-06 MED ORDER — SOLIFENACIN SUCCINATE 10 MG PO TABS: 10.0000 mg | ORAL_TABLET | Freq: Every day | ORAL | 2 refills | Status: DC

## 2019-10-06 NOTE — Progress Notes (Signed)
10/06/2019 9:02 PM   David Mccoy 01-05-1951 [id number]  Referring provider: Dion Body, MD [facility] [place],  [place] [address]  Chief Complaint  Patient presents with  . Prostate Cancer    Urologic history: 1. pT2c N0 M0 adenocarcinoma prostate             - RALP at [facility] 09/2015             -Gleason 3+4 with positive unifocal apical margin             -Declined adjuvant radiation             -PSA has remained undetectable  HPI: 69 y.o. male presents for annual follow-up.   At visit last year complaining of increased urinary frequency which was bothersome.  Was given a trial of Myrbetriq which he took for 3-4 months and did not note significant improvement  He is also developed increased urinary urgency with occasional episodes urge incontinence  Notes intermittent spraying of his urinary stream but typically voids with a good stream  Denies dysuria or gross hematuria  Denies flank, abdominal or pelvic pain  PSA 10/04/2019 remains undetectable at <0.1   PMH: Past Medical History:  Diagnosis Date  . Prostate CA [facility]) 09/2015    Surgical History: Past Surgical History:  Procedure Laterality Date  . TONSILLECTOMY      Home Medications:  Allergies as of 10/06/2019   No Known Allergies     Medication List       Accurate as of October 06, 2019  9:02 PM. If you have any questions, ask your nurse or doctor.        STOP taking these medications   mirabegron ER 50 MG Tb24 tablet Commonly known as: MYRBETRIQ Stopped by: Abbie Sons, MD   sildenafil 20 MG tablet Commonly known as: REVATIO Stopped by: Abbie Sons, MD     TAKE these medications   aspirin 81 MG EC tablet Take by mouth.   CALCIUM 1000 + D PO Take by mouth.   cholecalciferol 1000 units tablet Commonly known as: VITAMIN D Take 1,000 Units by mouth daily.   lansoprazole 15 MG capsule Commonly known as: PREVACID Take 15 mg by  mouth daily at 12 noon.   multivitamin tablet Take 1 tablet by mouth daily.   omega-3 acid ethyl esters 1 g capsule Commonly known as: LOVAZA Take by mouth 2 (two) times daily.   simvastatin 20 MG tablet Commonly known as: ZOCOR Take 40 mg by mouth daily.       Allergies: No Known Allergies  Family History: Family History  Problem Relation Age of Onset  . Bladder Cancer Neg Hx   . Kidney cancer Neg Hx   . Prostate cancer Neg Hx     Social History:  reports that he has never smoked. He has quit using smokeless tobacco. He reports current alcohol use. He reports that he does not use drugs.   Physical Exam: BP 135/86   Pulse 71   Ht 5\' 8"  (1.727 m)   Wt 180 lb (81.6 kg)   BMI 27.37 kg/m   Constitutional:  Alert and oriented, No acute distress. HEENT: [place] AT, moist mucus membranes.  Trachea midline, no masses. Cardiovascular: No clubbing, cyanosis, or edema. Respiratory: Normal respiratory effort, no increased work of breathing. GU: Phallus circumcised, meatus slightly small Skin: No rashes, bruises or suspicious lesions. Neurologic: Grossly intact, no focal deficits, moving all 4  extremities. Psychiatric: Normal mood and affect.   Assessment & Plan:    1. Prostate cancer ([place])  PSA remains undetectable for years postop  2.  Lower urinary tract symptoms  Frequency and urgency most bothersome  Intermittent spraying of urinary stream, meatus slightly small  Discussed possibility of urethral stricture or bladder neck contracture though less likely this far out.  Meatus slight small also discussed meatal dilation  No improvement with Myrbetriq, he was interested in a trial of anticholinergic medication and Rx solifenacin sent to pharmacy  Instructed to call earlier for worsening voiding symptoms he will otherwise follow-up in 6 months.   Abbie Sons, [place] [address] [place], [place] [address] [phone]

## 2019-10-07 ENCOUNTER — Encounter: Payer: Self-pay | Admitting: Urology

## 2019-12-01 DIAGNOSIS — H43812 Vitreous degeneration, left eye: Secondary | ICD-10-CM | POA: Diagnosis not present

## 2019-12-05 DIAGNOSIS — L821 Other seborrheic keratosis: Secondary | ICD-10-CM | POA: Diagnosis not present

## 2019-12-05 DIAGNOSIS — X32XXXA Exposure to sunlight, initial encounter: Secondary | ICD-10-CM | POA: Diagnosis not present

## 2019-12-05 DIAGNOSIS — D2271 Melanocytic nevi of right lower limb, including hip: Secondary | ICD-10-CM | POA: Diagnosis not present

## 2019-12-05 DIAGNOSIS — D2262 Melanocytic nevi of left upper limb, including shoulder: Secondary | ICD-10-CM | POA: Diagnosis not present

## 2019-12-05 DIAGNOSIS — D225 Melanocytic nevi of trunk: Secondary | ICD-10-CM | POA: Diagnosis not present

## 2019-12-05 DIAGNOSIS — L728 Other follicular cysts of the skin and subcutaneous tissue: Secondary | ICD-10-CM | POA: Diagnosis not present

## 2019-12-05 DIAGNOSIS — D2272 Melanocytic nevi of left lower limb, including hip: Secondary | ICD-10-CM | POA: Diagnosis not present

## 2019-12-05 DIAGNOSIS — D2261 Melanocytic nevi of right upper limb, including shoulder: Secondary | ICD-10-CM | POA: Diagnosis not present

## 2019-12-05 DIAGNOSIS — L570 Actinic keratosis: Secondary | ICD-10-CM | POA: Diagnosis not present

## 2019-12-12 DIAGNOSIS — Z86010 Personal history of colonic polyps: Secondary | ICD-10-CM | POA: Diagnosis not present

## 2020-01-31 DIAGNOSIS — E782 Mixed hyperlipidemia: Secondary | ICD-10-CM | POA: Diagnosis not present

## 2020-01-31 DIAGNOSIS — R7303 Prediabetes: Secondary | ICD-10-CM | POA: Diagnosis not present

## 2020-02-07 DIAGNOSIS — I700 Atherosclerosis of aorta: Secondary | ICD-10-CM | POA: Diagnosis not present

## 2020-02-07 DIAGNOSIS — R7303 Prediabetes: Secondary | ICD-10-CM | POA: Diagnosis not present

## 2020-02-07 DIAGNOSIS — E782 Mixed hyperlipidemia: Secondary | ICD-10-CM | POA: Diagnosis not present

## 2020-02-07 DIAGNOSIS — K219 Gastro-esophageal reflux disease without esophagitis: Secondary | ICD-10-CM | POA: Diagnosis not present

## 2020-02-10 DIAGNOSIS — K4090 Unilateral inguinal hernia, without obstruction or gangrene, not specified as recurrent: Secondary | ICD-10-CM | POA: Diagnosis not present

## 2020-02-13 ENCOUNTER — Other Ambulatory Visit: Payer: Self-pay

## 2020-02-13 ENCOUNTER — Ambulatory Visit
Admission: RE | Admit: 2020-02-13 | Discharge: 2020-02-13 | Disposition: A | Discharge status: Home / Self Care | Payer: PPO | Source: Ambulatory Visit | Attending: General Surgery | Admitting: General Surgery

## 2020-02-13 ENCOUNTER — Other Ambulatory Visit: Payer: Self-pay | Admitting: General Surgery

## 2020-02-13 ENCOUNTER — Ambulatory Visit: Payer: Self-pay | Admitting: General Surgery

## 2020-02-13 DIAGNOSIS — K4090 Unilateral inguinal hernia, without obstruction or gangrene, not specified as recurrent: Secondary | ICD-10-CM | POA: Diagnosis not present

## 2020-02-13 DIAGNOSIS — R1032 Left lower quadrant pain: Secondary | ICD-10-CM | POA: Insufficient documentation

## 2020-02-13 DIAGNOSIS — R1904 Left lower quadrant abdominal swelling, mass and lump: Secondary | ICD-10-CM | POA: Diagnosis not present

## 2020-02-13 DIAGNOSIS — K5730 Diverticulosis of large intestine without perforation or abscess without bleeding: Secondary | ICD-10-CM | POA: Diagnosis not present

## 2020-02-13 MED ORDER — IOHEXOL 300 MG/ML  SOLN
100.0000 mL | Freq: Once | INTRAMUSCULAR | Status: AC | PRN
  Administered 2020-02-13: 100 mL via INTRAVENOUS

## 2020-02-13 NOTE — H&P (Signed)
PATIENT PROFILE: David Mccoy is a 69 y.o. male who presents to the Clinic for consultation at the request of Dr. Netty Mccoy for evaluation of inguinal hernia.  PCP:  David Body, MD  HISTORY OF PRESENT ILLNESS: David Mccoy reports having groin pain since a week ago.  The pain is localized to the left groin.  He also reported the pain radiates to the left testicle.  He described swelling.  Aggravating factor is heavy lifting.  Alleviating factor is resting.  Denies abdominal distention nausea or vomiting.  Report associated constipation.  Denies any history of left inguinal hernia repair.  Patient does endorses having robotic assisted prostatectomy with right inguinal hernia repair.   PROBLEM LIST:         Problem List  Date Reviewed: 02/07/2020         Noted   Aortic atherosclerosis (CT 08/15/19) 08/23/2019   Emphysema lung (CT 08/15/19) 08/23/2019   Medicare annual wellness visit, subsequent 08/02/19 02/01/2019   Medicare annual wellness visit, initial 07/09/17 07/09/2017   Gastroesophageal reflux disease without esophagitis 01/07/2017   Mixed hyperlipidemia (LDL 99, Trig 170 - 01/31/20) 06/20/2014   David Mccoy syndrome Unknown   History of gout Unknown   Borderline diabetes mellitus (A1c 6.1% - 01/31/20) - diet controlled Unknown   History of prostate cancer s/p robotic prostatectomy (09/24/15) per Dr. Edd Mcc[facility] at [facility] - followed by Dr. Elenor Mccoy Unknown   Overview    with negative biopsy 2015, followed by Dr. Bernardo Mccoy         GENERAL REVIEW OF SYSTEMS:   General ROS: negative for - chills, fatigue, fever, weight gain or weight loss Allergy and Immunology ROS: negative for - hives  Hematological and Lymphatic ROS: negative for - bleeding problems or bruising, negative for palpable nodes Endocrine ROS: negative for - heat or cold intolerance, hair changes Respiratory ROS: negative for - cough, shortness of breath or wheezing Cardiovascular ROS: no chest pain or  palpitations GI ROS: negative for nausea, vomiting, abdominal pain, diarrhea, positive for right groin and constipation Musculoskeletal ROS: negative for - joint swelling or muscle pain Neurological ROS: negative for - confusion, syncope Dermatological ROS: negative for pruritus and rash Psychiatric: negative for anxiety, depression, difficulty sleeping and memory loss  MEDICATIONS: Current Medications        Current Outpatient Medications  Medication Sig Dispense Refill  . aspirin 81 MG EC tablet Take 1 tablet (81 mg total) by mouth once daily 30 tablet 11  . calcium carbonate (CALCIUM 500 ORAL) Take 1,000 mg by mouth    . cholecalciferol (CHOLECALCIFEROL) 1,000 unit tablet Take 1,000 Units by mouth once daily.     . cyanocobalamin (VITAMIN B12) 1000 MCG tablet Take 1,000 mcg by mouth once daily    . DOCOSAHEXANOIC ACID/EPA (FISH OIL ORAL) Take 1 capsule by mouth once daily.     . fluticasone propionate (FLONASE) 50 mcg/actuation nasal spray SPRAY TWICE INTO EACH NOSTRIL ONCE DAILY 48 g 3  . indomethacin (INDOCIN) 50 MG capsule Take 1 capsule (50 mg total) by mouth 3 (three) times daily as needed (FOR GOUT) 30 capsule 0  . lansoprazole (PREVACID) 15 MG DR capsule TAKE 1 CAPSULE BY MOUTH EVERY DAY 90 capsule 1  . multivitamin tablet Take 1 tablet by mouth once daily.    . simvastatin (ZOCOR) 40 MG tablet Take 1 tablet (40 mg total) by mouth nightly 90 tablet 1  . sodium, potassium, and magnesium (SUPREP) oral solution Take 2 Bottles (1 kit total) by mouth  as directed One kit contains 2 bottles.  Take both bottles at the times instructed by your provider. 354 mL 0   No current facility-administered medications for this visit.      ALLERGIES: Patient has no known allergies.  PAST MEDICAL HISTORY:     Past Medical History:  Diagnosis Date  . Borderline diabetes mellitus   . Colon polyp 05/07/2006   Adenomatous Polyp  . Diverticulosis 07/13/2009  . Emphysema lung (CT  08/15/19) 08/23/2019  . GERD (gastroesophageal reflux disease)   . Gilbert syndrome   . History of elevated PSA    with negative biopsy 2013, followed by Dr. Bernardo Mccoy  . History of gout   . History of prostate cancer   . Internal hemorrhoids 05/07/2006  . Mixed hyperlipidemia   . Prostate cancer (CMS-HCC)   . PUD (peptic ulcer disease) 1985  . Serrated adenoma of colon, unspecified 07/27/2014  . Tubular adenoma of colon, unspecified 07/27/2014    PAST SURGICAL HISTORY:      Past Surgical History:  Procedure Laterality Date  . COLONOSCOPY  07/13/2009, 05/07/2006   Dr. Kurtis Mccoy @ [place] - [facility] Adenomatous Polyps  . COLONOSCOPY  07/27/2014   Dr. Kurtis Mccoy @ [place] - Adenomatous Polyps, one sessile serrated, rpt 5 yrs per [facility]  . PROSTATE SURGERY  09/24/2015  . Robotic laparoscopy prostectomy   09/24/2015   Done by Dr. Edd Mccoy- [facility]     FAMILY HISTORY:      Family History  Problem Relation Age of Onset  . Stroke Mother        Age 71- suffer from dementi,died a few weeks later  . Thyroid disease Mother   . Heart disease Father   . Coronary Artery Disease (Blocked arteries around heart) Father        Triple bypass at age 71, died at 36  . Myocardial Infarction (Heart attack) Brother   . Pacemaker Brother   . Coronary Artery Disease (Blocked arteries around heart) Brother        heart attack at age 31, pace maker age  13     SOCIAL HISTORY: Social History          Socioeconomic History  . Marital status: Married    Spouse name: Not on file  . Number of children: Not on file  . Years of education: Not on file  . Highest education level: Not on file  Occupational History  . Not on file  Tobacco Use  . Smoking status: Former Smoker    Packs/day: 1.00    Years: 30.00    Pack years: 30.00    Types: Cigars    Start date: 02/17/1978    Quit date: 02/27/2008    Years since quitting: 11.9  . Smokeless tobacco: Current User    Types: Chew   . Tobacco comment: no desire to smoke  Vaping Use  . Vaping Use: Never used  Substance and Sexual Activity  . Alcohol use: Yes    Alcohol/week: 0.0 standard drinks    Comment: couple glasses of wine  . Drug use: No  . Sexual activity: Not Currently    Partners: Female  Other Topics Concern  . Not on file  Social History Narrative   Marital status- Married   Lives with wife   Employment- Scientist, research (life sciences) (executive VP)   Supplements- Multiple supplements   Exercise hx- Walks 2 miles 3x/weekly   Religious affliation- Christian   Social Determinants of Radio broadcast assistant  Strain: Not on file  Food Insecurity: Not on file  Transportation Needs: Not on file      PHYSICAL EXAM:    Vitals:   02/13/20 0835  BP: 146/88  Pulse: 72   Mccoy mass index is 27.47 kg/m. Weight: 84.4 kg (186 lb)   GENERAL: Alert, active, oriented x3  HEENT: Pupils equal reactive to light. Extraocular movements are intact. Sclera clear. Palpebral conjunctiva normal red color.Pharynx clear.  NECK: Supple with no palpable mass and no adenopathy.  LUNGS: Sound clear with no rales rhonchi or wheezes.  HEART: Regular rhythm S1 and S2 without murmur.  ABDOMEN: Soft and depressible, nontender with no palpable mass, no hepatomegaly.  Palpable left groin bulging unable to be completely reduced.  Mild tender to palpation  EXTREMITIES: Well-developed well-nourished symmetrical with no dependent edema.  NEUROLOGICAL: Awake alert oriented, facial expression symmetrical, moving all extremities.  REVIEW OF DATA: I have reviewed the following data today:      Appointment on 01/31/2020  Component Date Value  . Glucose 01/31/2020 101   . Sodium 01/31/2020 141   . Potassium 01/31/2020 4.6   . Chloride 01/31/2020 103   . Carbon Dioxide (CO2) 01/31/2020 30.4   . Urea Nitrogen (BUN) 01/31/2020 13   . Creatinine 01/31/2020 0.9   . Glomerular Filtration Ra* 01/31/2020  84   . Calcium 01/31/2020 9.5   . AST  01/31/2020 19   . ALT  01/31/2020 28   . Alk Phos (alkaline Phosp* 01/31/2020 58   . Albumin 01/31/2020 4.3   . Bilirubin, Total 01/31/2020 1.4*  . Protein, Total 01/31/2020 6.8   . A/G Ratio 01/31/2020 1.7   . Cholesterol, Total 01/31/2020 186   . Triglyceride 01/31/2020 170   . HDL (High Density Lipopr* 01/31/2020 53.5   . LDL Calculated 01/31/2020 99   . VLDL Cholesterol 01/31/2020 34   . Cholesterol/HDL Ratio 01/31/2020 3.5   . Hemoglobin A1C 01/31/2020 6.1*  . Average Blood Glucose (C* 01/31/2020 128      ASSESSMENT: Mr. Amendola is a 69 y.o. male presenting for consultation for left inguinal hernia.    The patient presents with a symptomatic left inguinal hernia. Patient was oriented about the diagnosis of inguinal hernia and its implication. The patient was oriented about the treatment alternatives (observation vs surgical repair). Due to patient symptoms, repair is recommended. Patient oriented about the surgical procedure, the use of mesh and its risk of complications such as: infection, bleeding, injury to vas deference, vasculature and testicle, injury to bowel or bladder, and chronic pain.  Due to patient history of prostatectomy and the fact that he is using aspirin I will coordinate his surgery next Monday to let the patient get out of the aspirin to decrease the risk of bleeding.  Usually process surgery make the surgery more difficult due to scar tissue on the plane of dissection and is more prone to bleeding.  Patient oriented about this risk and he agreed to proceed with surgery.  I ordered a CT scan of the pelvis for complete evaluation due to the patient previous history of prostate surgery.  There is confirmed left inguinal hernia with sigmoid colon inside inguinal hernia without any sign of obstruction.  We will proceed with discussed plan.  Inguinal pain, left [R10.32]  PLAN: 1. STAT CT scan of the pelvis with IV  contrast 2. Robotic assisted laparoscopic left inguinal hernia repair with mesh ([id number]) 3.  Hold aspirin 5 days before procedure 4.  Contact us  if has any question or concern.  Patient and/or representative verbalized understanding, all questions were answered, and were agreeable with the plan outlined above.    Herbert Pun, MD  Electronically signed by Herbert Pun, MD

## 2020-02-13 NOTE — H&P (View-Only) (Signed)
PATIENT PROFILE: RIAAN TOLEDO is a 69 y.o. male who presents to the Clinic for consultation at the request of Dr. Netty Starring for evaluation of inguinal hernia.  PCP:  Dion Body, MD  HISTORY OF PRESENT ILLNESS: [name] reports having groin pain since a week ago.  The pain is localized to the left groin.  He also reported the pain radiates to the left testicle.  He described swelling.  Aggravating factor is heavy lifting.  Alleviating factor is resting.  Denies abdominal distention nausea or vomiting.  Report associated constipation.  Denies any history of left inguinal hernia repair.  Patient does endorses having robotic assisted prostatectomy with right inguinal hernia repair.   PROBLEM LIST:         Problem List  Date Reviewed: 02/07/2020         Noted   Aortic atherosclerosis (CT 08/15/19) 08/23/2019   Emphysema lung (CT 08/15/19) 08/23/2019   Medicare annual wellness visit, subsequent 08/02/19 02/01/2019   Medicare annual wellness visit, initial 07/09/17 07/09/2017   Gastroesophageal reflux disease without esophagitis 01/07/2017   Mixed hyperlipidemia (LDL 99, Trig 170 - 01/31/20) 06/20/2014   Rosanna Randy syndrome Unknown   History of gout Unknown   Borderline diabetes mellitus (A1c 6.1% - 01/31/20) - diet controlled Unknown   History of prostate cancer s/p robotic prostatectomy (09/24/15) per Dr. Edd [facility] at [facility] - followed by Dr. Elenor Quinones Unknown   Overview    with negative biopsy 2015, followed by Dr. Bernardo Heater         GENERAL REVIEW OF SYSTEMS:   General ROS: negative for - chills, fatigue, fever, weight gain or weight loss Allergy and Immunology ROS: negative for - hives  Hematological and Lymphatic ROS: negative for - bleeding problems or bruising, negative for palpable nodes Endocrine ROS: negative for - heat or cold intolerance, hair changes Respiratory ROS: negative for - cough, shortness of breath or wheezing Cardiovascular ROS: no chest pain or  palpitations GI ROS: negative for nausea, vomiting, abdominal pain, diarrhea, positive for right groin and constipation Musculoskeletal ROS: negative for - joint swelling or muscle pain Neurological ROS: negative for - confusion, syncope Dermatological ROS: negative for pruritus and rash Psychiatric: negative for anxiety, depression, difficulty sleeping and memory loss  MEDICATIONS: Current Medications        Current Outpatient Medications  Medication Sig Dispense Refill  . aspirin 81 MG EC tablet Take 1 tablet (81 mg total) by mouth once daily 30 tablet 11  . calcium carbonate (CALCIUM 500 ORAL) Take 1,000 mg by mouth    . cholecalciferol (CHOLECALCIFEROL) 1,000 unit tablet Take 1,000 Units by mouth once daily.     . cyanocobalamin (VITAMIN B12) 1000 MCG tablet Take 1,000 mcg by mouth once daily    . DOCOSAHEXANOIC ACID/EPA (FISH OIL ORAL) Take 1 capsule by mouth once daily.     . fluticasone propionate (FLONASE) 50 mcg/actuation nasal spray SPRAY TWICE INTO EACH NOSTRIL ONCE DAILY 48 g 3  . indomethacin (INDOCIN) 50 MG capsule Take 1 capsule (50 mg total) by mouth 3 (three) times daily as needed (FOR GOUT) 30 capsule 0  . lansoprazole (PREVACID) 15 MG DR capsule TAKE 1 CAPSULE BY MOUTH EVERY DAY 90 capsule 1  . multivitamin tablet Take 1 tablet by mouth once daily.    . simvastatin (ZOCOR) 40 MG tablet Take 1 tablet (40 mg total) by mouth nightly 90 tablet 1  . sodium, potassium, and magnesium (SUPREP) oral solution Take 2 Bottles (1 kit total) by mouth  as directed One kit contains 2 bottles.  Take both bottles at the times instructed by your provider. 354 mL 0   No current facility-administered medications for this visit.      ALLERGIES: Patient has no known allergies.  PAST MEDICAL HISTORY:     Past Medical History:  Diagnosis Date  . Borderline diabetes mellitus   . Colon polyp 05/07/2006   Adenomatous Polyp  . Diverticulosis 07/13/2009  . Emphysema lung (CT  08/15/19) 08/23/2019  . GERD (gastroesophageal reflux disease)   . Gilbert syndrome   . History of elevated PSA    with negative biopsy 2013, followed by Dr. Bernardo Heater  . History of gout   . History of prostate cancer   . Internal hemorrhoids 05/07/2006  . Mixed hyperlipidemia   . Prostate cancer (CMS-HCC)   . PUD (peptic ulcer disease) 1985  . Serrated adenoma of colon, unspecified 07/27/2014  . Tubular adenoma of colon, unspecified 07/27/2014    PAST SURGICAL HISTORY:      Past Surgical History:  Procedure Laterality Date  . COLONOSCOPY  07/13/2009, 05/07/2006   Dr. Kurtis Bushman @ [place] [facility] Adenomatous Polyps  . COLONOSCOPY  07/27/2014   Dr. Kurtis Bushman @ [place] - Adenomatous Polyps, one sessile serrated, rpt 5 yrs per [facility]  . PROSTATE SURGERY  09/24/2015  . Robotic laparoscopy prostectomy   09/24/2015   Done by Dr. Edd Arbour- [facility]     FAMILY HISTORY:      Family History  Problem Relation Age of Onset  . Stroke Mother        Age 25- suffer from dementi,died a few weeks later  . Thyroid disease Mother   . Heart disease Father   . Coronary Artery Disease (Blocked arteries around heart) Father        Triple bypass at age 45, died at 84  . Myocardial Infarction (Heart attack) Brother   . Pacemaker Brother   . Coronary Artery Disease (Blocked arteries around heart) Brother        heart attack at age 18, pace maker age  33     SOCIAL HISTORY: Social History          Socioeconomic History  . Marital status: Married    Spouse name: Not on file  . Number of children: Not on file  . Years of education: Not on file  . Highest education level: Not on file  Occupational History  . Not on file  Tobacco Use  . Smoking status: Former Smoker    Packs/day: 1.00    Years: 30.00    Pack years: 30.00    Types: Cigars    Start date: 02/17/1978    Quit date: 02/27/2008    Years since quitting: 11.9  . Smokeless tobacco: Current User    Types: Chew   . Tobacco comment: no desire to smoke  Vaping Use  . Vaping Use: Never used  Substance and Sexual Activity  . Alcohol use: Yes    Alcohol/week: 0.0 standard drinks    Comment: couple glasses of wine  . Drug use: No  . Sexual activity: Not Currently    Partners: Female  Other Topics Concern  . Not on file  Social History Narrative   Marital status- Married   Lives with wife   Employment- Scientist, research (life sciences) (executive VP)   Supplements- Multiple supplements   Exercise hx- Walks 2 miles 3x/weekly   Religious affliation- Christian   Social Determinants of Radio broadcast assistant  Strain: Not on file  Food Insecurity: Not on file  Transportation Needs: Not on file      PHYSICAL EXAM:    Vitals:   02/13/20 0835  BP: 146/88  Pulse: 72   Body mass index is 27.47 kg/m. Weight: 84.4 kg (186 lb)   GENERAL: Alert, active, oriented x3  HEENT: Pupils equal reactive to light. Extraocular movements are intact. Sclera clear. Palpebral conjunctiva normal red color.Pharynx clear.  NECK: Supple with no palpable mass and no adenopathy.  LUNGS: Sound clear with no rales rhonchi or wheezes.  HEART: Regular rhythm S1 and S2 without murmur.  ABDOMEN: Soft and depressible, nontender with no palpable mass, no hepatomegaly.  Palpable left groin bulging unable to be completely reduced.  Mild tender to palpation  EXTREMITIES: Well-developed well-nourished symmetrical with no dependent edema.  NEUROLOGICAL: Awake alert oriented, facial expression symmetrical, moving all extremities.  REVIEW OF DATA: I have reviewed the following data today:      Appointment on 01/31/2020  Component Date Value  . Glucose 01/31/2020 101   . Sodium 01/31/2020 141   . Potassium 01/31/2020 4.6   . Chloride 01/31/2020 103   . Carbon Dioxide (CO2) 01/31/2020 30.4   . Urea Nitrogen (BUN) 01/31/2020 13   . Creatinine 01/31/2020 0.9   . Glomerular Filtration Ra* 01/31/2020  84   . Calcium 01/31/2020 9.5   . AST  01/31/2020 19   . ALT  01/31/2020 28   . Alk Phos (alkaline Phosp* 01/31/2020 58   . Albumin 01/31/2020 4.3   . Bilirubin, Total 01/31/2020 1.4*  . Protein, Total 01/31/2020 6.8   . A/G Ratio 01/31/2020 1.7   . Cholesterol, Total 01/31/2020 186   . Triglyceride 01/31/2020 170   . HDL (High Density Lipopr* 01/31/2020 53.5   . LDL Calculated 01/31/2020 99   . VLDL Cholesterol 01/31/2020 34   . Cholesterol/HDL Ratio 01/31/2020 3.5   . Hemoglobin A1C 01/31/2020 6.1*  . Average Blood Glucose (C* 01/31/2020 128      ASSESSMENT: Mr. Spradley is a 69 y.o. male presenting for consultation for left inguinal hernia.    The patient presents with a symptomatic left inguinal hernia. Patient was oriented about the diagnosis of inguinal hernia and its implication. The patient was oriented about the treatment alternatives (observation vs surgical repair). Due to patient symptoms, repair is recommended. Patient oriented about the surgical procedure, the use of mesh and its risk of complications such as: infection, bleeding, injury to vas deference, vasculature and testicle, injury to bowel or bladder, and chronic pain.  Due to patient history of prostatectomy and the fact that he is using aspirin I will coordinate his surgery next Monday to let the patient get out of the aspirin to decrease the risk of bleeding.  Usually process surgery make the surgery more difficult due to scar tissue on the plane of dissection and is more prone to bleeding.  Patient oriented about this risk and he agreed to proceed with surgery.  I ordered a CT scan of the pelvis for complete evaluation due to the patient previous history of prostate surgery.  There is confirmed left inguinal hernia with sigmoid colon inside inguinal hernia without any sign of obstruction.  We will proceed with discussed plan.  Inguinal pain, left [R10.32]  PLAN: 1. STAT CT scan of the pelvis with IV  contrast 2. Robotic assisted laparoscopic left inguinal hernia repair with mesh ([id number]) 3.  Hold aspirin 5 days before procedure 4.  Contact us  if has any question or concern.  Patient and/or representative verbalized understanding, all questions were answered, and were agreeable with the plan outlined above.    Herbert Pun, MD  Electronically signed by Herbert Pun, MD

## 2020-02-15 ENCOUNTER — Other Ambulatory Visit
Admission: RE | Admit: 2020-02-15 | Discharge: 2020-02-15 | Disposition: A | Discharge status: Home / Self Care | Payer: PPO | Source: Ambulatory Visit | Attending: General Surgery | Admitting: General Surgery

## 2020-02-15 ENCOUNTER — Other Ambulatory Visit: Payer: Self-pay

## 2020-02-15 DIAGNOSIS — Z01810 Encounter for preprocedural cardiovascular examination: Secondary | ICD-10-CM | POA: Diagnosis not present

## 2020-02-15 HISTORY — DX: Gilbert syndrome: E80.4

## 2020-02-15 HISTORY — DX: Hyperlipidemia, unspecified: E78.5

## 2020-02-15 HISTORY — DX: Diverticulosis of intestine, part unspecified, without perforation or abscess without bleeding: K57.90

## 2020-02-15 HISTORY — DX: Emphysema, unspecified: J43.9

## 2020-02-15 HISTORY — DX: Gastro-esophageal reflux disease without esophagitis: K21.9

## 2020-02-15 HISTORY — DX: Peptic ulcer, site unspecified, unspecified as acute or chronic, without hemorrhage or perforation: K27.9

## 2020-02-15 HISTORY — DX: Gout, unspecified: M10.9

## 2020-02-15 NOTE — Patient Instructions (Signed)
Your procedure is scheduled on: February 20, 2020 Monday  Report to the Registration Desk on the 1st floor of the [facility] To find out your arrival time, please call [phone] between 1PM - 3PM on: February 15, 2021  REMEMBER: Instructions that are not followed completely may result in serious medical risk, up to and including death; or upon the discretion of your surgeon and anesthesiologist your surgery may need to be rescheduled.  Do not eat food after midnight the night before surgery.  No gum chewing, lozengers or hard candies.  You may however, drink CLEAR liquids up to 2 hours before you are scheduled to arrive for your surgery. Do not drink anything within 2 hours of your scheduled arrival time.  Clear liquids include: - water  - apple juice without pulp - gatorade (not RED, PURPLE, OR BLUE) - black coffee or tea (Do NOT add milk or creamers to the coffee or tea) Do NOT drink anything that is not on this list.  Type 1 and Type 2 diabetics should only drink water.  TAKE THESE MEDICATIONS THE MORNING OF SURGERY WITH A SIP OF WATER: SIMVASTATIN PREVACID (take one the night before and one on the morning of surgery - helps to prevent nausea after surgery.)  Follow recommendations from Cardiologist, Pulmonologist or PCP regarding stopping Aspirin, Coumadin, Plavix, Eliquis, Pradaxa, or Pletal. LAST DOSE 12/ 27/ 2021  One week prior to surgery: Stop Anti-inflammatories (NSAIDS) such as Advil, Aleve, Ibuprofen, Motrin, Naproxen, Naprosyn and Aspirin based products such as Excedrin, Goodys Powder, BC Powder.         USE TYLENOL IF NEEDED Stop ANY OVER THE COUNTER supplements until after surgery. (However, you may continue taking Vitamin D, Vitamin B, and multivitamin up until the day before surgery.)  No Alcohol for 24 hours before or after surgery.  No Smoking including e-cigarettes for 24 hours prior to surgery.  No chewable tobacco products for at least 6 hours prior to  surgery.  No nicotine patches on the day of surgery.  Do not use any "recreational" drugs for at least a week prior to your surgery.  Please be advised that the combination of cocaine and anesthesia may have negative outcomes, up to and including death. If you test positive for cocaine, your surgery will be cancelled.  On the morning of surgery brush your teeth with toothpaste and water, you may rinse your mouth with mouthwash if you wish. Do not swallow any toothpaste or mouthwash.  Do not wear jewelry, make-up, hairpins, clips or nail polish.  Do not wear lotions, powders, or perfumes OR DEODORANT OR AFTERSHAVE  Do not shave body from the neck down 48 hours prior to surgery just in case you cut yourself which could leave a site for infection.  Also, freshly shaved skin may become irritated if using the CHG soap.  Contact lenses, hearing aids and dentures may not be worn into surgery.  Do not bring valuables to the hospital. [facility] is not responsible for any missing/lost belongings or valuables.   Use CHG Soap as directed on instruction sheet.  Notify your doctor if there is any change in your medical condition (cold, fever, infection).  Wear comfortable clothing (specific to your surgery type) to the hospital.  Plan for stool softeners for home use; pain medications have a tendency to cause constipation. You can also help prevent constipation by eating foods high in fiber such as fruits and vegetables and drinking plenty of fluids as your diet  allows.  After surgery, you can help prevent lung complications by doing breathing exercises.  Take deep breaths and cough every 1-2 hours. Your doctor may order a device called an Incentive Spirometer to help you take deep breaths. When coughing or sneezing, hold a pillow firmly against your incision with both hands. This is called "splinting." Doing this helps protect your incision. It also decreases belly discomfort.  If you are  being discharged the day of surgery, you will not be allowed to drive home. You will need a responsible adult (18 years or older) to drive you home and stay with you that night.   Please call the [place] Dept. at [phone] if you have any questions about these instructions.  Visitation Policy:  Patients undergoing a surgery or procedure may have one family member or support person with them as long as that person is not COVID-19 positive or experiencing its symptoms.  That person may remain in the waiting area during the procedure.  Inpatient Visitation Update:   In an effort to ensure the safety of our team members and our patients, we are implementing a change to our visitation policy:  Effective Monday, Aug. 9, at 7 a.m., inpatients will be allowed one support person.  o The support person may change daily.  o The support person must pass our screening, gel in and out, and wear a mask at all times, including in the patient's room.  o Patients must also wear a mask when staff or their support person are in the room.  o Masking is required regardless of vaccination status.  Systemwide, no visitors 17 or younger.

## 2020-02-16 ENCOUNTER — Encounter
Admission: RE | Admit: 2020-02-16 | Discharge: 2020-02-16 | Disposition: A | Discharge status: Home / Self Care | Payer: PPO | Source: Ambulatory Visit | Attending: General Surgery | Admitting: General Surgery

## 2020-02-16 DIAGNOSIS — Z01818 Encounter for other preprocedural examination: Secondary | ICD-10-CM | POA: Diagnosis not present

## 2020-02-16 DIAGNOSIS — Z20822 Contact with and (suspected) exposure to covid-19: Secondary | ICD-10-CM | POA: Diagnosis not present

## 2020-02-17 ENCOUNTER — Other Ambulatory Visit: Payer: Self-pay

## 2020-02-17 ENCOUNTER — Other Ambulatory Visit
Admission: RE | Admit: 2020-02-17 | Discharge: 2020-02-17 | Disposition: A | Discharge status: Home / Self Care | Payer: PPO | Source: Ambulatory Visit | Attending: General Surgery | Admitting: General Surgery

## 2020-02-17 DIAGNOSIS — Z01818 Encounter for other preprocedural examination: Secondary | ICD-10-CM | POA: Diagnosis not present

## 2020-02-18 LAB — SARS CORONAVIRUS 2 (TAT 6-24 HRS): SARS Coronavirus 2: NEGATIVE

## 2020-02-20 ENCOUNTER — Ambulatory Visit: Payer: PPO | Admitting: Anesthesiology

## 2020-02-20 ENCOUNTER — Ambulatory Visit
Admission: RE | Admit: 2020-02-20 | Discharge: 2020-02-20 | Disposition: A | Discharge status: Home / Self Care | Payer: PPO | Attending: General Surgery | Admitting: General Surgery

## 2020-02-20 ENCOUNTER — Other Ambulatory Visit: Payer: Self-pay

## 2020-02-20 ENCOUNTER — Encounter: Payer: Self-pay | Admitting: General Surgery

## 2020-02-20 ENCOUNTER — Encounter
Admission: RE | Disposition: A | Discharge status: Home / Self Care | Payer: Self-pay | Source: Home / Self Care | Attending: General Surgery

## 2020-02-20 DIAGNOSIS — C61 Malignant neoplasm of prostate: Secondary | ICD-10-CM | POA: Diagnosis not present

## 2020-02-20 DIAGNOSIS — K5900 Constipation, unspecified: Secondary | ICD-10-CM | POA: Diagnosis not present

## 2020-02-20 DIAGNOSIS — K4090 Unilateral inguinal hernia, without obstruction or gangrene, not specified as recurrent: Secondary | ICD-10-CM | POA: Insufficient documentation

## 2020-02-20 DIAGNOSIS — Z79899 Other long term (current) drug therapy: Secondary | ICD-10-CM | POA: Insufficient documentation

## 2020-02-20 DIAGNOSIS — K4190 Unilateral femoral hernia, without obstruction or gangrene, not specified as recurrent: Secondary | ICD-10-CM | POA: Insufficient documentation

## 2020-02-20 DIAGNOSIS — Z87891 Personal history of nicotine dependence: Secondary | ICD-10-CM | POA: Diagnosis not present

## 2020-02-20 DIAGNOSIS — K219 Gastro-esophageal reflux disease without esophagitis: Secondary | ICD-10-CM | POA: Diagnosis not present

## 2020-02-20 DIAGNOSIS — J439 Emphysema, unspecified: Secondary | ICD-10-CM | POA: Diagnosis not present

## 2020-02-20 HISTORY — PX: XI ROBOTIC ASSISTED INGUINAL HERNIA REPAIR WITH MESH: SHX6706

## 2020-02-20 SURGERY — REPAIR, HERNIA, INGUINAL, ROBOT-ASSISTED, LAPAROSCOPIC, USING MESH
Anesthesia: General | Site: Abdomen | Laterality: Left

## 2020-02-20 MED ORDER — KETOROLAC TROMETHAMINE 30 MG/ML IJ SOLN
INTRAMUSCULAR | Status: AC
  Filled 2020-02-20: qty 1

## 2020-02-20 MED ORDER — CHLORHEXIDINE GLUCONATE 0.12 % MT SOLN: 15.0000 mL | Freq: Once | OROMUCOSAL | Status: AC

## 2020-02-20 MED ORDER — CHLORHEXIDINE GLUCONATE 0.12 % MT SOLN
OROMUCOSAL | Status: AC
  Administered 2020-02-20: 15 mL via OROMUCOSAL
  Filled 2020-02-20: qty 15

## 2020-02-20 MED ORDER — PROPOFOL 10 MG/ML IV BOLUS
INTRAVENOUS | Status: AC
  Filled 2020-02-20: qty 20

## 2020-02-20 MED ORDER — LACTATED RINGERS IV SOLN: INTRAVENOUS | Status: DC

## 2020-02-20 MED ORDER — LIDOCAINE HCL (CARDIAC) PF 100 MG/5ML IV SOSY
PREFILLED_SYRINGE | INTRAVENOUS | Status: DC | PRN
  Administered 2020-02-20: 100 mg via INTRAVENOUS

## 2020-02-20 MED ORDER — DEXAMETHASONE SODIUM PHOSPHATE 10 MG/ML IJ SOLN
INTRAMUSCULAR | Status: AC
  Filled 2020-02-20: qty 1

## 2020-02-20 MED ORDER — HYDROCODONE-ACETAMINOPHEN 5-325 MG PO TABS
1.0000 | ORAL_TABLET | Freq: Once | ORAL | Status: AC
Start: 2020-02-20 — End: 2020-02-20
  Administered 2020-02-20: 1 via ORAL

## 2020-02-20 MED ORDER — ACETAMINOPHEN 10 MG/ML IV SOLN
INTRAVENOUS | Status: DC | PRN
  Administered 2020-02-20: 1000 mg via INTRAVENOUS

## 2020-02-20 MED ORDER — HYDROCODONE-ACETAMINOPHEN 5-325 MG PO TABS: 1.0000 | ORAL_TABLET | ORAL | 0 refills | Status: AC | PRN

## 2020-02-20 MED ORDER — ROCURONIUM BROMIDE 10 MG/ML (PF) SYRINGE
PREFILLED_SYRINGE | INTRAVENOUS | Status: AC
  Filled 2020-02-20: qty 10

## 2020-02-20 MED ORDER — ORAL CARE MOUTH RINSE: 15.0000 mL | Freq: Once | OROMUCOSAL | Status: AC

## 2020-02-20 MED ORDER — ONDANSETRON HCL 4 MG/2ML IJ SOLN
INTRAMUSCULAR | Status: DC | PRN
  Administered 2020-02-20 (×2): 4 mg via INTRAVENOUS

## 2020-02-20 MED ORDER — KETOROLAC TROMETHAMINE 30 MG/ML IJ SOLN
INTRAMUSCULAR | Status: DC | PRN
  Administered 2020-02-20: 30 mg via INTRAVENOUS

## 2020-02-20 MED ORDER — FENTANYL CITRATE (PF) 100 MCG/2ML IJ SOLN
INTRAMUSCULAR | Status: AC
  Filled 2020-02-20: qty 2

## 2020-02-20 MED ORDER — BUPIVACAINE-EPINEPHRINE (PF) 0.25% -1:200000 IJ SOLN
INTRAMUSCULAR | Status: AC
  Filled 2020-02-20: qty 30

## 2020-02-20 MED ORDER — ROCURONIUM BROMIDE 100 MG/10ML IV SOLN
INTRAVENOUS | Status: DC | PRN
  Administered 2020-02-20 (×2): 20 mg via INTRAVENOUS
  Administered 2020-02-20: 10 mg via INTRAVENOUS
  Administered 2020-02-20: 20 mg via INTRAVENOUS
  Administered 2020-02-20: 50 mg via INTRAVENOUS
  Administered 2020-02-20 (×2): 20 mg via INTRAVENOUS

## 2020-02-20 MED ORDER — CEFAZOLIN SODIUM-DEXTROSE 2-4 GM/100ML-% IV SOLN
INTRAVENOUS | Status: AC
  Filled 2020-02-20: qty 100

## 2020-02-20 MED ORDER — MIDAZOLAM HCL 2 MG/2ML IJ SOLN
INTRAMUSCULAR | Status: DC | PRN
  Administered 2020-02-20: 2 mg via INTRAVENOUS

## 2020-02-20 MED ORDER — DEXAMETHASONE SODIUM PHOSPHATE 10 MG/ML IJ SOLN
INTRAMUSCULAR | Status: DC | PRN
  Administered 2020-02-20: 10 mg via INTRAVENOUS

## 2020-02-20 MED ORDER — MIDAZOLAM HCL 2 MG/2ML IJ SOLN
INTRAMUSCULAR | Status: AC
  Filled 2020-02-20: qty 2

## 2020-02-20 MED ORDER — PHENYLEPHRINE HCL (PRESSORS) 10 MG/ML IV SOLN
INTRAVENOUS | Status: DC | PRN
  Administered 2020-02-20 (×7): 100 ug via INTRAVENOUS

## 2020-02-20 MED ORDER — ACETAMINOPHEN 10 MG/ML IV SOLN
INTRAVENOUS | Status: AC
  Filled 2020-02-20: qty 100

## 2020-02-20 MED ORDER — OXYCODONE HCL 5 MG PO TABS: 5.0000 mg | ORAL_TABLET | Freq: Once | ORAL | Status: DC | PRN

## 2020-02-20 MED ORDER — CEFAZOLIN SODIUM-DEXTROSE 2-4 GM/100ML-% IV SOLN
2.0000 g | INTRAVENOUS | Status: AC
  Administered 2020-02-20: 2 g via INTRAVENOUS

## 2020-02-20 MED ORDER — BUPIVACAINE-EPINEPHRINE 0.25% -1:200000 IJ SOLN
INTRAMUSCULAR | Status: DC | PRN
  Administered 2020-02-20: 30 mL

## 2020-02-20 MED ORDER — PROPOFOL 10 MG/ML IV BOLUS
INTRAVENOUS | Status: DC | PRN
  Administered 2020-02-20: 150 mg via INTRAVENOUS

## 2020-02-20 MED ORDER — HYDROCODONE-ACETAMINOPHEN 5-325 MG PO TABS
ORAL_TABLET | ORAL | Status: AC
  Filled 2020-02-20: qty 1

## 2020-02-20 MED ORDER — FENTANYL CITRATE (PF) 100 MCG/2ML IJ SOLN: 25.0000 ug | INTRAMUSCULAR | Status: DC | PRN

## 2020-02-20 MED ORDER — GLYCOPYRROLATE 0.2 MG/ML IJ SOLN
INTRAMUSCULAR | Status: DC | PRN
  Administered 2020-02-20: .2 mg via INTRAVENOUS

## 2020-02-20 MED ORDER — OXYCODONE HCL 5 MG/5ML PO SOLN: 5.0000 mg | Freq: Once | ORAL | Status: DC | PRN

## 2020-02-20 MED ORDER — GLYCOPYRROLATE 0.2 MG/ML IJ SOLN
INTRAMUSCULAR | Status: AC
  Filled 2020-02-20: qty 1

## 2020-02-20 MED ORDER — FENTANYL CITRATE (PF) 100 MCG/2ML IJ SOLN
INTRAMUSCULAR | Status: AC
  Administered 2020-02-20: 25 ug via INTRAVENOUS
  Filled 2020-02-20: qty 2

## 2020-02-20 MED ORDER — FENTANYL CITRATE (PF) 100 MCG/2ML IJ SOLN
INTRAMUSCULAR | Status: DC | PRN
  Administered 2020-02-20: 50 ug via INTRAVENOUS

## 2020-02-20 MED ORDER — SEVOFLURANE IN SOLN
RESPIRATORY_TRACT | Status: AC
  Filled 2020-02-20: qty 250

## 2020-02-20 MED ORDER — SUGAMMADEX SODIUM 200 MG/2ML IV SOLN
INTRAVENOUS | Status: DC | PRN
  Administered 2020-02-20: 400 mg via INTRAVENOUS

## 2020-02-20 MED ORDER — ONDANSETRON HCL 4 MG/2ML IJ SOLN
INTRAMUSCULAR | Status: AC
  Filled 2020-02-20: qty 2

## 2020-02-20 SURGICAL SUPPLY — 55 items
ADH SKN CLS APL DERMABOND .7 (GAUZE/BANDAGES/DRESSINGS) ×1
APL PRP STRL LF DISP 70% ISPRP (MISCELLANEOUS) ×1
BAG INFUSER PRESSURE 100CC (MISCELLANEOUS) IMPLANT
BLADE SURG SZ11 CARB STEEL (BLADE) ×2 IMPLANT
CANISTER SUCT 1200ML W/VALVE (MISCELLANEOUS) ×2 IMPLANT
CHLORAPREP W/TINT 26 (MISCELLANEOUS) ×2 IMPLANT
COVER TIP SHEARS 8 DVNC (MISCELLANEOUS) ×1 IMPLANT
COVER TIP SHEARS 8MM DA VINCI (MISCELLANEOUS) ×4
COVER WAND RF STERILE (DRAPES) ×4 IMPLANT
DEFOGGER SCOPE WARMER CLEARIFY (MISCELLANEOUS) ×2 IMPLANT
DERMABOND ADVANCED (GAUZE/BANDAGES/DRESSINGS) ×1
DERMABOND ADVANCED .7 DNX12 (GAUZE/BANDAGES/DRESSINGS) ×1 IMPLANT
DRAPE ARM DVNC X/XI (DISPOSABLE) ×3 IMPLANT
DRAPE COLUMN DVNC XI (DISPOSABLE) ×1 IMPLANT
DRAPE DA VINCI XI ARM (DISPOSABLE) ×6
DRAPE DA VINCI XI COLUMN (DISPOSABLE) ×2
ELECT REM PT RETURN 9FT ADLT (ELECTROSURGICAL) ×2
ELECTRODE REM PT RTRN 9FT ADLT (ELECTROSURGICAL) ×1 IMPLANT
GLOVE BIO SURGEON STRL SZ 6.5 (GLOVE) ×5 IMPLANT
GLOVE BIOGEL PI IND STRL 6.5 (GLOVE) ×2 IMPLANT
GLOVE BIOGEL PI INDICATOR 6.5 (GLOVE) ×3
GOWN STRL REUS W/ TWL LRG LVL3 (GOWN DISPOSABLE) ×3 IMPLANT
GOWN STRL REUS W/TWL LRG LVL3 (GOWN DISPOSABLE) ×6
GRASPER SUT TROCAR 14GX15 (MISCELLANEOUS) ×1 IMPLANT
IRRIGATOR SUCT 8 DISP DVNC XI (IRRIGATION / IRRIGATOR) IMPLANT
IRRIGATOR SUCTION 8MM XI DISP (IRRIGATION / IRRIGATOR)
IV CATH ANGIO 12GX3 LT BLUE (NEEDLE) ×1 IMPLANT
IV NS 1000ML (IV SOLUTION)
IV NS 1000ML BAXH (IV SOLUTION) IMPLANT
KIT PINK PAD W/HEAD ARE REST (MISCELLANEOUS) ×2
KIT PINK PAD W/HEAD ARM REST (MISCELLANEOUS) ×1 IMPLANT
LABEL OR SOLS (LABEL) ×1 IMPLANT
MANIFOLD NEPTUNE II (INSTRUMENTS) ×2 IMPLANT
MESH 3DMAX 4X6 LT LRG (Mesh General) ×1 IMPLANT
NDL INSUFFLATION 14GA 120MM (NEEDLE) ×1 IMPLANT
NEEDLE HYPO 22GX1.5 SAFETY (NEEDLE) ×2 IMPLANT
NEEDLE INSUFFLATION 14GA 120MM (NEEDLE) ×2 IMPLANT
OBTURATOR OPTICAL STANDARD 8MM (TROCAR) ×2
OBTURATOR OPTICAL STND 8 DVNC (TROCAR) ×1
OBTURATOR OPTICALSTD 8 DVNC (TROCAR) ×1 IMPLANT
PACK LAP CHOLECYSTECTOMY (MISCELLANEOUS) ×2 IMPLANT
SEAL CANN UNIV 5-8 DVNC XI (MISCELLANEOUS) ×3 IMPLANT
SEAL XI 5MM-8MM UNIVERSAL (MISCELLANEOUS) ×6
SET TUBE SMOKE EVAC HIGH FLOW (TUBING) ×2 IMPLANT
SOLUTION ELECTROLUBE (MISCELLANEOUS) ×2 IMPLANT
SPONGE LAP 4X18 RFD (DISPOSABLE) ×1 IMPLANT
SUT MNCRL 4-0 (SUTURE) ×2
SUT MNCRL 4-0 27XMFL (SUTURE) ×1
SUT VIC AB 0 CT1 36 (SUTURE) ×1 IMPLANT
SUT VIC AB 2-0 SH 27 (SUTURE) ×2
SUT VIC AB 2-0 SH 27XBRD (SUTURE) ×1 IMPLANT
SUT VLOC 90 S/L VL9 GS22 (SUTURE) ×3 IMPLANT
SUTURE MNCRL 4-0 27XMF (SUTURE) ×1 IMPLANT
TAPE TRANSPORE STRL 2 31045 (GAUZE/BANDAGES/DRESSINGS) ×1 IMPLANT
TRAY FOLEY MTR SLVR 16FR STAT (SET/KITS/TRAYS/PACK) ×2 IMPLANT

## 2020-02-20 NOTE — Anesthesia Postprocedure Evaluation (Signed)
Anesthesia Post Note  Patient: David Mccoy  Procedure(s) Performed: XI ROBOTIC ASSISTED INGUINAL HERNIA REPAIR WITH MESH (Left Abdomen)  Patient location during evaluation: PACU Anesthesia Type: General Level of consciousness: awake and alert Pain management: pain level controlled Vital Signs Assessment: post-procedure vital signs reviewed and stable Respiratory status: spontaneous breathing and respiratory function stable Cardiovascular status: stable Anesthetic complications: no   No complications documented.   Last Vitals:  Vitals:   02/20/20 1605 02/20/20 1620  BP: (!) 133/91 (!) 136/91  Pulse: 74 69  Resp: 16 15  Temp: (!) 36.3 C 36.6 C  SpO2: 95% 95%    Last Pain:  Vitals:   02/20/20 1550  TempSrc:   PainSc: 2                  Jivan Symanski K

## 2020-02-20 NOTE — Interval H&P Note (Signed)
History and Physical Interval Note:  02/20/2020 11:05 AM  David Mccoy  has presented today for surgery, with the diagnosis of R10.31 Inguinal hernia pain, left.  The various methods of treatment have been discussed with the patient and family. After consideration of risks, benefits and other options for treatment, the patient has consented to  Procedure(s): XI ROBOTIC ASSISTED INGUINAL HERNIA REPAIR WITH MESH (Left) vs bilateral as a surgical intervention.  The patient's history has been reviewed, patient examined, no change in status, stable for surgery.  I have reviewed the patient's chart and labs.  Left side marked in the pre procedure room. Questions were answered to the patient's satisfaction.     Carolan Shiver

## 2020-02-20 NOTE — Anesthesia Procedure Notes (Signed)
Procedure Name: Intubation Date/Time: 02/20/2020 11:57 AM Performed by: Irving Burton, CRNA Pre-anesthesia Checklist: Patient identified, Emergency Drugs available, Suction available and Patient being monitored Patient Re-evaluated:Patient Re-evaluated prior to induction Oxygen Delivery Method: Circle system utilized Preoxygenation: Pre-oxygenation with 100% oxygen Induction Type: IV induction Ventilation: Mask ventilation without difficulty Laryngoscope Size: McGraph and 4 Grade View: Grade I Tube type: Oral Tube size: 7.5 mm Number of attempts: 1 Airway Equipment and Method: Stylet and Video-laryngoscopy Placement Confirmation: ETT inserted through vocal cords under direct vision,  positive ETCO2 and breath sounds checked- equal and bilateral Secured at: 23 cm Tube secured with: Tape Dental Injury: Teeth and Oropharynx as per pre-operative assessment

## 2020-02-20 NOTE — Transfer of Care (Signed)
Immediate Anesthesia Transfer of Care Note  Patient: David Mccoy  Procedure(s) Performed: XI ROBOTIC ASSISTED INGUINAL HERNIA REPAIR WITH MESH (Left Abdomen)  Patient Location: PACU  Anesthesia Type:General  Level of Consciousness: drowsy  Airway & Oxygen Therapy: Patient Spontanous Breathing  Post-op Assessment: Report given to RN and Post -op Vital signs reviewed and stable  Post vital signs: Reviewed and stable  Last Vitals:  Vitals Value Taken Time  BP 137/82 02/20/20 1536  Temp 36.2 C 02/20/20 1536  Pulse 78 02/20/20 1547  Resp 16 02/20/20 1547  SpO2 95 % 02/20/20 1547  Vitals shown include unvalidated device data.  Last Pain:  Vitals:   02/20/20 1543  TempSrc:   PainSc: 5          Complications: No complications documented.

## 2020-02-20 NOTE — Discharge Instructions (Signed)
  Diet: Resume home heart healthy regular diet.   Activity: No heavy lifting >20 pounds (children, pets, laundry, garbage) or strenuous activity until follow-up, but light activity and walking are encouraged. Do not drive or drink alcohol if taking narcotic pain medications.  Wound care: May shower with soapy water and pat dry (do not rub incisions), but no baths or submerging incision underwater until follow-up. (no swimming)   Medications: Resume all home medications. For mild to moderate pain: acetaminophen (Tylenol) or ibuprofen (if no kidney disease). Combining Tylenol with alcohol can substantially increase your risk of causing liver disease. Narcotic pain medications, if prescribed, can be used for severe pain, though may cause nausea, constipation, and drowsiness. Do not combine Tylenol and Norco within a 6 hour period as Norco contains Tylenol. If you do not need the narcotic pain medication, you do not need to fill the prescription.  Call office [phone] at any time if any questions, worsening pain, fevers/chills, bleeding, drainage from incision site, or other concerns.   AMBULATORY SURGERY  DISCHARGE INSTRUCTIONS   1) The drugs that you were given will stay in your system until tomorrow so for the next 24 hours you should not:  A) Drive an automobile B) Make any legal decisions C) Drink any alcoholic beverage   2) You may resume regular meals tomorrow.  Today it is better to start with liquids and gradually work up to solid foods.  You may eat anything you prefer, but it is better to start with liquids, then soup and crackers, and gradually work up to solid foods.   3) Please notify your doctor immediately if you have any unusual bleeding, trouble breathing, redness and pain at the surgery site, drainage, fever, or pain not relieved by medication.  4) Your post-operative visit with Dr.                                     is: Date:                        Time:     Please call to schedule your post-operative visit.  5) Additional Instructions:

## 2020-02-20 NOTE — Op Note (Signed)
Preoperative diagnosis: Left inguinal hernia.   Postoperative diagnosis: Left inguinal hernia.  Procedure: Robotic assisted Laparoscopic Transabdominal preperitoneal laparoscopic (TAPP) repair of left inguinal hernia.  Anesthesia: GETA  Surgeon: Dr. Hazle Quant  Wound Classification: Clean  Indications:  Patient is a 70 y.o. male developed a symptomatic left inguinal hernia. Repair was indicated.  Findings: 1. Left indirect, direct and femoral Inguinal hernia with abundant amount of scar tissue from previous prostatectomy identified  2. Vas deferens and cord structures identified and preserved 3. Bard 3D Max mesh used for repair 4. Adequate hemostasis.   Description of procedure: The patient was taken to the operating room and the correct side of surgery was verified. The patient was placed supine with arms tucked at the sides. After obtaining adequate anesthesia, the patient's abdomen was prepped and draped in standard sterile fashion. The patient was placed in the Trendelenburg position. A time-out was completed verifying correct patient, procedure, site, positioning, and implant(s) and/or special equipment prior to beginning this procedure. A Veress needle was placed at the umbilicus and pneumoperitoneum created with insufflation of carbon dioxide to 15 mmHg. After the Veress needle was removed, an 8-mm trocar was placed on epigastric area and the 30 angled laparoscope inserted. Two 8-mm trocars were then placed lateral to the rectus sheath under direct visualization. Both inguinal regions were inspected and the median umbilical ligament, medial umbilical ligament, and lateral umbilical fold were identified.  The robotic arms were docked. The robotic scope was inserted and the pelvic area anatomy targeted.  The peritoneum was incised with scissors along a line 5 cm above the superior edge of the hernia defect, extending from the median umbilical ligament to the anterior superior iliac  spine. The peritoneal flap was mobilized inferiorly using blunt and sharp dissection. This was a difficult and time consuming dissection duet to scar tissue from previous prostatectomy. The inferior epigastric vessels were exposed and the pubic symphysis was identified. Cooper's ligament was dissected to its junction with the iliac vein. The dissection was continued inferiorly to the iliopubic tract, with care taken to avoid injury to the femoral branch of the genitofemoral nerve and the lateral femoral cutaneous nerve. The cord structures were parietalized. The indirect, direct and femoral hernias was identified and reduced by gentle traction.  The indirect hernia sac was noted mobilized from the cord structures and reduced into the peritoneal cavity.  A large piece of mesh was rolled longitudinally into a compact cylinder and passed through a trocar. The cylinder was placed along the inferior aspect of the working space and unrolled into place to completely cover the direct, indirect, and femoral spaces. The mesh was secured into place superiorly to the anterior abdominal wall and inferiorly and medially to Cooper's ligament with absorbable sutures. Care was taken to avoid the inferolateral triangles containing the iliac vessels and genital nerves. The peritoneal flap was closed over the mesh and secured with suture in similar positions of safety. After ensuring adequate hemostasis, the trocars were removed and the pneumoperitoneum allowed to escape. The trocar incisions were closed using monocryl and skin adhesive dressings applied.  The patient tolerated the procedure well and was taken to the postanesthesia care unit in stable condition.   Specimen: None  Complications: None  Estimated Blood Loss: 10 mL

## 2020-02-20 NOTE — Anesthesia Preprocedure Evaluation (Signed)
Anesthesia Evaluation  Patient identified by MRN, date of birth, ID band Patient awake    Reviewed: Allergy & Precautions, H&P , NPO status , Patient's Chart, lab work & pertinent test results  History of Anesthesia Complications Negative for: history of anesthetic complications  Airway Mallampati: III  TM Distance: <3 FB Neck ROM: limited    Dental  (+) Chipped   Pulmonary neg shortness of breath, COPD, former smoker,    Pulmonary exam normal        Cardiovascular Exercise Tolerance: Good (-) angina(-) Past MI and (-) DOE negative cardio ROS Normal cardiovascular exam     Neuro/Psych negative neurological ROS  negative psych ROS   GI/Hepatic Neg liver ROS, PUD, GERD  Medicated and Controlled,  Endo/Other  negative endocrine ROS  Renal/GU      Musculoskeletal   Abdominal   Peds  Hematology negative hematology ROS (+)   Anesthesia Other Findings Past Medical History: 2008: Colon polyp No date: Diverticulosis No date: Emphysema lung ([facility]CC) No date: GERD (gastroesophageal reflux disease) 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 No date: Gout No date: Hyperlipidemia No date: Peptic ulcer disease 09/2015: Prostate CA ([facility]) 2016: Tubular adenoma of colon  Past Surgical History: No date: COLONOSCOPY     Comment:  2008, 2011, 2016 2017: ROBOTIC LAPAROSCOPY PROSTECTOMY     Comment:  AND INGUINAL HERNIA REPAIR No date: TONSILLECTOMY     Reproductive/Obstetrics negative OB ROS                             Anesthesia Physical Anesthesia Plan  ASA: III  Anesthesia Plan: General ETT   Post-op Pain Management:    Induction: Intravenous  PONV Risk Score and Plan: Ondansetron, Dexamethasone, Midazolam and Treatment may vary due to age or medical condition  Airway Management Planned: Oral ETT  Additional Equipment:   Intra-op Plan:   Post-operative Plan: Extubation in OR  Informed Consent:  I have reviewed the patients History and Physical, chart, labs and discussed the procedure including the risks, benefits and alternatives for the proposed anesthesia with the patient or authorized representative who has indicated his/her understanding and acceptance.     Dental Advisory Given  Plan Discussed with: Anesthesiologist, CRNA and Surgeon  Anesthesia Plan Comments: (Patient consented for risks of anesthesia including but not limited to:  - adverse reactions to medications - damage to eyes, teeth, lips or other oral mucosa - nerve damage due to positioning  - sore throat or hoarseness - Damage to heart, brain, nerves, lungs, other parts of body or loss of life  Patient voiced understanding.)        Anesthesia Quick Evaluation

## 2020-02-21 ENCOUNTER — Encounter: Payer: Self-pay | Admitting: General Surgery

## 2020-03-07 ENCOUNTER — Other Ambulatory Visit: Admission: RE | Admit: 2020-03-07 | Payer: PPO | Source: Ambulatory Visit

## 2020-04-04 ENCOUNTER — Other Ambulatory Visit
Admission: RE | Admit: 2020-04-04 | Discharge: 2020-04-04 | Disposition: A | Discharge status: Home / Self Care | Payer: PPO | Source: Ambulatory Visit | Attending: Gastroenterology | Admitting: Gastroenterology

## 2020-04-04 ENCOUNTER — Other Ambulatory Visit: Payer: Self-pay

## 2020-04-04 DIAGNOSIS — Z01812 Encounter for preprocedural laboratory examination: Secondary | ICD-10-CM | POA: Insufficient documentation

## 2020-04-04 DIAGNOSIS — Z20822 Contact with and (suspected) exposure to covid-19: Secondary | ICD-10-CM | POA: Insufficient documentation

## 2020-04-04 LAB — SARS CORONAVIRUS 2 (TAT 6-24 HRS): SARS Coronavirus 2: NEGATIVE

## 2020-04-05 ENCOUNTER — Encounter: Payer: Self-pay | Admitting: *Deleted

## 2020-04-06 ENCOUNTER — Ambulatory Visit: Payer: PPO | Admitting: Certified Registered"

## 2020-04-06 ENCOUNTER — Ambulatory Visit
Admission: RE | Admit: 2020-04-06 | Discharge: 2020-04-06 | Disposition: A | Discharge status: Home / Self Care | Payer: PPO | Attending: Gastroenterology | Admitting: Gastroenterology

## 2020-04-06 ENCOUNTER — Other Ambulatory Visit: Payer: Self-pay

## 2020-04-06 ENCOUNTER — Encounter
Admission: RE | Disposition: A | Discharge status: Home / Self Care | Payer: Self-pay | Source: Home / Self Care | Attending: Gastroenterology

## 2020-04-06 DIAGNOSIS — D124 Benign neoplasm of descending colon: Secondary | ICD-10-CM | POA: Insufficient documentation

## 2020-04-06 DIAGNOSIS — E785 Hyperlipidemia, unspecified: Secondary | ICD-10-CM | POA: Diagnosis not present

## 2020-04-06 DIAGNOSIS — K5790 Diverticulosis of intestine, part unspecified, without perforation or abscess without bleeding: Secondary | ICD-10-CM | POA: Diagnosis not present

## 2020-04-06 DIAGNOSIS — D123 Benign neoplasm of transverse colon: Secondary | ICD-10-CM | POA: Insufficient documentation

## 2020-04-06 DIAGNOSIS — K635 Polyp of colon: Secondary | ICD-10-CM | POA: Diagnosis not present

## 2020-04-06 DIAGNOSIS — Z7982 Long term (current) use of aspirin: Secondary | ICD-10-CM | POA: Insufficient documentation

## 2020-04-06 DIAGNOSIS — Z1211 Encounter for screening for malignant neoplasm of colon: Secondary | ICD-10-CM | POA: Diagnosis not present

## 2020-04-06 DIAGNOSIS — K5730 Diverticulosis of large intestine without perforation or abscess without bleeding: Secondary | ICD-10-CM | POA: Diagnosis not present

## 2020-04-06 DIAGNOSIS — Z86010 Personal history of colonic polyps: Secondary | ICD-10-CM | POA: Insufficient documentation

## 2020-04-06 DIAGNOSIS — Z79899 Other long term (current) drug therapy: Secondary | ICD-10-CM | POA: Insufficient documentation

## 2020-04-06 DIAGNOSIS — D120 Benign neoplasm of cecum: Secondary | ICD-10-CM | POA: Diagnosis not present

## 2020-04-06 DIAGNOSIS — K640 First degree hemorrhoids: Secondary | ICD-10-CM | POA: Insufficient documentation

## 2020-04-06 DIAGNOSIS — C61 Malignant neoplasm of prostate: Secondary | ICD-10-CM | POA: Diagnosis not present

## 2020-04-06 DIAGNOSIS — D128 Benign neoplasm of rectum: Secondary | ICD-10-CM | POA: Insufficient documentation

## 2020-04-06 DIAGNOSIS — K219 Gastro-esophageal reflux disease without esophagitis: Secondary | ICD-10-CM | POA: Diagnosis not present

## 2020-04-06 HISTORY — PX: COLONOSCOPY WITH PROPOFOL: SHX5780

## 2020-04-06 HISTORY — DX: Prediabetes: R73.03

## 2020-04-06 SURGERY — COLONOSCOPY WITH PROPOFOL
Anesthesia: General

## 2020-04-06 MED ORDER — PROPOFOL 10 MG/ML IV BOLUS
INTRAVENOUS | Status: DC | PRN
  Administered 2020-04-06: 80 mg via INTRAVENOUS

## 2020-04-06 MED ORDER — PROPOFOL 500 MG/50ML IV EMUL
INTRAVENOUS | Status: DC | PRN
  Administered 2020-04-06: 130 ug/kg/min via INTRAVENOUS

## 2020-04-06 MED ORDER — SODIUM CHLORIDE 0.9 % IV SOLN: INTRAVENOUS | Status: DC

## 2020-04-06 NOTE — H&P (Signed)
Outpatient short stay form Pre-procedure 04/06/2020 1:10 PM David Miyamoto MD, MPH  Primary Physician: Dr. Netty Starring  Reason for visit:  Surveillance colonoscopy  History of present illness:   70 y/o gentleman with history of polyps here for surveillance colonoscopy. Had recent hernia repair and doing well afterwards. No family history of GI malignancies. Hx of prostate surgery. No blood thinners.    Current Facility-Administered Medications:  .  0.9 %  sodium chloride infusion, , Intravenous, Continuous, Brent Taillon, Hilton Cork, MD  Medications Prior to Admission  Medication Sig Dispense Refill Last Dose  . Carboxymethylcellul-Glycerin (LUBRICATING EYE DROPS OP) Place 1 drop into both eyes daily.   04/06/2020 at Unknown time  . fluticasone (FLONASE) 50 MCG/ACT nasal spray Place 2 sprays into both nostrils daily.   Past Month at Unknown time  . Omega-3 Fatty Acids (FISH OIL) 1000 MG CAPS Take 1,000 mg by mouth daily.   Past Week at Unknown time  . simvastatin (ZOCOR) 40 MG tablet Take 40 mg by mouth daily.   04/05/2020 at Unknown time  . aspirin 81 MG EC tablet Take 81 mg by mouth daily.   04/04/2020  . Calcium Carb-Cholecalciferol (CALCIUM 1000 + D PO) Take 1 tablet by mouth daily.   04/04/2020  . cholecalciferol (VITAMIN D) 1000 units tablet Take 1,000 Units by mouth daily.   04/04/2020  . ibuprofen (ADVIL) 200 MG tablet Take 600 mg by mouth every 6 (six) hours as needed for moderate pain.     Marland Kitchen lansoprazole (PREVACID) 15 MG capsule Take 15 mg by mouth daily.   04/04/2020  . Multiple Vitamin (MULTIVITAMIN) tablet Take 1 tablet by mouth daily.   04/04/2020  . naproxen sodium (ALEVE) 220 MG tablet Take 440 mg by mouth 2 (two) times daily as needed (pain).     Marland Kitchen solifenacin (VESICARE) 10 MG tablet Take 1 tablet (10 mg total) by mouth daily. (Patient not taking: No sig reported) 30 tablet 2   . vitamin B-12 (CYANOCOBALAMIN) 1000 MCG tablet Take 1,000 mcg by mouth daily.   04/04/2020     No Known  Allergies   Past Medical History:  Diagnosis Date  . Colon polyp 2008  . Diverticulosis   . Emphysema lung ([place])   . GERD (gastroesophageal reflux disease)   . Gilbert syndrome   . Gout   . Gout   . Hyperlipidemia   . Peptic ulcer disease   . Pre-diabetes   . Prostate CA ([place]) 09/2015  . Tubular adenoma of colon 2016    Review of systems:  Otherwise negative.    Physical Exam  Gen: Alert, oriented. Appears stated age.  HEENT: PERRLA. Lungs: No respiratory distress CV: RRR Abd: soft, benign, no masses Ext: No edema    Planned procedures: Proceed with colonoscopy. The patient understands the nature of the planned procedure, indications, risks, alternatives and potential complications including but not limited to bleeding, infection, perforation, damage to internal organs and possible oversedation/side effects from anesthesia. The patient agrees and gives consent to proceed.  Please refer to procedure notes for findings, recommendations and patient disposition/instructions.     David Miyamoto MD, MPH Gastroenterology 04/06/2020  1:10 PM

## 2020-04-06 NOTE — Interval H&P Note (Signed)
History and Physical Interval Note:  04/06/2020 1:12 PM  David Mccoy  has presented today for surgery, with the diagnosis of P HX POLYPS.  The various methods of treatment have been discussed with the patient and family. After consideration of risks, benefits and other options for treatment, the patient has consented to  Procedure(s): COLONOSCOPY WITH PROPOFOL (N/A) as a surgical intervention.  The patient's history has been reviewed, patient examined, no change in status, stable for surgery.  I have reviewed the patient's chart and labs.  Questions were answered to the patient's satisfaction.     Lesly Rubenstein  Ok to proceed with colonoscopy

## 2020-04-06 NOTE — Transfer of Care (Signed)
Immediate Anesthesia Transfer of Care Note  Patient: David Mccoy  Procedure(s) Performed: COLONOSCOPY WITH PROPOFOL (N/A )  Patient Location: PACU and Endoscopy Unit  Anesthesia Type:General  Level of Consciousness: drowsy  Airway & Oxygen Therapy: Patient Spontanous Breathing  Post-op Assessment: Report given to RN  Post vital signs: stable  Last Vitals:  Vitals Value Taken Time  BP    Temp    Pulse    Resp    SpO2      Last Pain:  Vitals:   04/06/20 1316  TempSrc: Temporal  PainSc: 0-No pain         Complications: No complications documented.

## 2020-04-06 NOTE — Op Note (Signed)
[facility] Gastroenterology Patient Name: David Mccoy Procedure Date: 04/06/2020 1:23 PM MRN: [id number] Account #: [id number] Date of Birth: April 22, 1950 Admit Type: Outpatient Age: 70 Room: [facility] ENDO ROOM 3 Gender: Male Note Status: Finalized Procedure:             Colonoscopy Indications:           High risk colon cancer surveillance: Personal history                         of colonic polyps Providers:             Andrey Farmer MD, MD Referring MD:          Dion Body (Referring MD) Medicines:             Monitored Anesthesia Care Complications:         No immediate complications. Estimated blood loss:                         Minimal. Procedure:             Pre-Anesthesia Assessment:                        - Prior to the procedure, a History and Physical was                         performed, and patient medications and allergies were                         reviewed. The patient is competent. The risks and                         benefits of the procedure and the sedation options and                         risks were discussed with the patient. All questions                         were answered and informed consent was obtained.                         Patient identification and proposed procedure were                         verified by the physician, the nurse, the anesthetist                         and the technician in the endoscopy suite. Mental                         Status Examination: alert and oriented. Airway                         Examination: normal oropharyngeal airway and neck                         mobility. Respiratory Examination: clear to                         auscultation.  CV Examination: normal. Prophylactic                         Antibiotics: The patient does not require prophylactic                         antibiotics. Prior Anticoagulants: The patient has                         taken no previous anticoagulant or  antiplatelet                         agents. ASA Grade Assessment: II - A patient with mild                         systemic disease. After reviewing the risks and                         benefits, the patient was deemed in satisfactory                         condition to undergo the procedure. The anesthesia                         plan was to use monitored anesthesia care (MAC).                         Immediately prior to administration of medications,                         the patient was re-assessed for adequacy to receive                         sedatives. The heart rate, respiratory rate, oxygen                         saturations, blood pressure, adequacy of pulmonary                         ventilation, and response to care were monitored                         throughout the procedure. The physical status of the                         patient was re-assessed after the procedure.                        After obtaining informed consent, the colonoscope was                         passed under direct vision. Throughout the procedure,                         the patient's blood pressure, pulse, and oxygen                         saturations were monitored continuously. The  Colonoscope was introduced through the anus and                         advanced to the the terminal ileum. The colonoscopy                         was performed without difficulty. The patient                         tolerated the procedure well. The quality of the bowel                         preparation was good. Findings:      The perianal and digital rectal examinations were normal.      The terminal ileum appeared normal.      A 1 mm polyp was found in the cecum. The polyp was sessile. The polyp       was removed with a jumbo cold forceps. Resection and retrieval were       complete. Estimated blood loss was minimal.      A 5 mm polyp was found in the transverse colon. The polyp  was sessile.       The polyp was removed with a cold snare. Resection and retrieval were       complete. Estimated blood loss was minimal.      A 3 mm polyp was found in the descending colon. The polyp was sessile.       The polyp was removed with a cold snare. Resection and retrieval were       complete. Estimated blood loss was minimal.      A 4 mm polyp was found in the rectum. The polyp was sessile. The polyp       was removed with a cold snare. Resection and retrieval were complete.       Estimated blood loss was minimal.      Multiple small-mouthed diverticula were found in the sigmoid colon.      Internal hemorrhoids were found during retroflexion. The hemorrhoids       were Grade I (internal hemorrhoids that do not prolapse).      The exam was otherwise without abnormality on direct and retroflexion       views. Impression:            - The examined portion of the ileum was normal.                        - One 1 mm polyp in the cecum, removed with a jumbo                         cold forceps. Resected and retrieved.                        - One 5 mm polyp in the transverse colon, removed with                         a cold snare. Resected and retrieved.                        - One 3 mm polyp in the descending colon, removed with  a cold snare. Resected and retrieved.                        - One 4 mm polyp in the rectum, removed with a cold                         snare. Resected and retrieved. Please note, the                         descending colon polyp was thought to be suctioned and                         placed in the jar labeled descending colon polyp but                         when the rectal polyp was suctioned, two polyps                         appeared to come through so they were both placed in                         the rectal jar.                        - Diverticulosis in the sigmoid colon.                        - Internal  hemorrhoids.                        - The examination was otherwise normal on direct and                         retroflexion views. Recommendation:        - Discharge patient to home.                        - Resume previous diet.                        - Continue present medications.                        - Await pathology results.                        - Repeat colonoscopy for surveillance based on                         pathology results.                        - Return to referring physician as previously                         scheduled. Procedure Code(s):     --- Professional ---                        [phone], Colonoscopy, flexible; with removal of  tumor(s), polyp(s), or other lesion(s) by snare                         technique                        45380, 59, Colonoscopy, flexible; with biopsy, single                         or multiple Diagnosis Code(s):     --- Professional ---                        Z86.010, Personal history of colonic polyps                        K64.0, First degree hemorrhoids                        K63.5, Polyp of colon                        K62.1, Rectal polyp                        K57.30, Diverticulosis of large intestine without                         perforation or abscess without bleeding CPT copyright 2019 American Medical Association. All rights reserved. The codes documented in this report are preliminary and upon coder review may  be revised to meet current compliance requirements. Andrey Farmer MD, MD 04/06/2020 1:50:52 PM Number of Addenda: 0 Note Initiated On: 04/06/2020 1:23 PM Scope Withdrawal Time: 0 hours 13 minutes 40 seconds  Total Procedure Duration: 0 hours 17 minutes 50 seconds  Estimated Blood Loss:  Estimated blood loss was minimal.      [facility]

## 2020-04-08 NOTE — Anesthesia Postprocedure Evaluation (Signed)
Anesthesia Post Note  Patient: David Mccoy  Procedure(s) Performed: COLONOSCOPY WITH PROPOFOL (N/A )  Patient location during evaluation: PACU Anesthesia Type: General Level of consciousness: awake and alert Pain management: pain level controlled Vital Signs Assessment: post-procedure vital signs reviewed and stable Respiratory status: spontaneous breathing, nonlabored ventilation, respiratory function stable and patient connected to nasal cannula oxygen Cardiovascular status: blood pressure returned to baseline and stable Postop Assessment: no apparent nausea or vomiting Anesthetic complications: no   No complications documented.   Last Vitals:  Vitals:   04/06/20 1400 04/06/20 1410  BP: 112/79 121/83  Pulse: 69 63  Resp: (!) 27 14  Temp:    SpO2: 95% 96%    Last Pain:  Vitals:   04/07/20 1339  TempSrc:   PainSc: 0-No pain                 Molli Barrows

## 2020-04-08 NOTE — Anesthesia Preprocedure Evaluation (Signed)
Anesthesia Evaluation  Patient identified by MRN, date of birth, ID band Patient awake    Reviewed: Allergy & Precautions, H&P , NPO status , Patient's Chart, lab work & pertinent test results, reviewed documented beta blocker date and time   Airway Mallampati: II   Neck ROM: full    Dental  (+) Poor Dentition   Pulmonary COPD, former smoker,    Pulmonary exam normal        Cardiovascular Exercise Tolerance: Poor negative cardio ROS Normal cardiovascular exam Rhythm:regular Rate:Normal     Neuro/Psych negative neurological ROS  negative psych ROS   GI/Hepatic Neg liver ROS, PUD, GERD  ,  Endo/Other  negative endocrine ROS  Renal/GU negative Renal ROS  negative genitourinary   Musculoskeletal   Abdominal   Peds  Hematology negative hematology ROS (+)   Anesthesia Other Findings Past Medical History: 2008: Colon polyp No date: Diverticulosis No date: Emphysema lung ([place]) No date: GERD (gastroesophageal reflux disease) No date: Rosanna Randy syndrome No date: Gout No date: Gout No date: Hyperlipidemia No date: Peptic ulcer disease No date: Pre-diabetes 09/2015: Prostate CA ([place]) 2016: Tubular adenoma of colon Past Surgical History: No date: COLONOSCOPY     Comment:  2008, 2011, 2016 No date: HERNIA REPAIR 2017: ROBOTIC LAPAROSCOPY PROSTECTOMY     Comment:  AND INGUINAL HERNIA REPAIR No date: TONSILLECTOMY 02/20/2020: XI ROBOTIC ASSISTED INGUINAL HERNIA REPAIR WITH MESH; Left     Comment:  Procedure: XI ROBOTIC ASSISTED INGUINAL HERNIA REPAIR               WITH MESH;  Surgeon: Herbert Pun, MD;                Location: [facility] ORS;  Service: General;  Laterality: Left; BMI    Body Mass Index: 26.58 kg/m     Reproductive/Obstetrics negative OB ROS                             Anesthesia Physical Anesthesia Plan  ASA: III  Anesthesia Plan: General   Post-op Pain Management:     Induction:   PONV Risk Score and Plan:   Airway Management Planned:   Additional Equipment:   Intra-op Plan:   Post-operative Plan:   Informed Consent: I have reviewed the patients History and Physical, chart, labs and discussed the procedure including the risks, benefits and alternatives for the proposed anesthesia with the patient or authorized representative who has indicated his/her understanding and acceptance.     Dental Advisory Given  Plan Discussed with: CRNA  Anesthesia Plan Comments:         Anesthesia Quick Evaluation

## 2020-04-09 ENCOUNTER — Encounter: Payer: Self-pay | Admitting: Gastroenterology

## 2020-04-09 ENCOUNTER — Ambulatory Visit: Payer: Self-pay | Admitting: Urology

## 2020-04-09 LAB — SURGICAL PATHOLOGY

## 2020-04-10 ENCOUNTER — Other Ambulatory Visit: Payer: Self-pay

## 2020-04-10 ENCOUNTER — Other Ambulatory Visit: Payer: PPO

## 2020-04-10 DIAGNOSIS — C61 Malignant neoplasm of prostate: Secondary | ICD-10-CM

## 2020-04-11 ENCOUNTER — Ambulatory Visit: Payer: PPO | Admitting: Urology

## 2020-04-11 ENCOUNTER — Encounter: Payer: Self-pay | Admitting: Urology

## 2020-04-11 VITALS — BP 158/82 | HR 90 | Ht 69.0 in | Wt 180.0 lb

## 2020-04-11 DIAGNOSIS — N35911 Unspecified urethral stricture, male, meatal: Secondary | ICD-10-CM | POA: Diagnosis not present

## 2020-04-11 DIAGNOSIS — C61 Malignant neoplasm of prostate: Secondary | ICD-10-CM

## 2020-04-11 DIAGNOSIS — N3281 Overactive bladder: Secondary | ICD-10-CM

## 2020-04-11 LAB — PSA: Prostate Specific Ag, Serum: <0.1 ng/mL (ref 0.0–4.0)

## 2020-04-11 NOTE — Progress Notes (Signed)
04/11/2020 9:13 AM   David Mccoy 06/20/1950 [id number]  Referring provider: Dion Body, MD [facility] [place],  [place] [id number]  Chief Complaint  Patient presents with  . Prostate Cancer     Urologic history: 1. pT2c N0 M0[facility] -Gleason 3+4 with positive unifocal apical margin -Declined adjuvant radiation -PSA has remained undetectable  HPI: 70 y.o. male presents for 57-month follow-up.   Stable frequency/urgency but no improvement with Myrbetriq or solifenacin  Intermittent spraying of the urinary stream and noted to have a slightly small meatus at last visit and we discussed meatal dilation  He did undergo a hernia repair ~ 6 weeks ago and had a catheter intraoperatively.  There was no problems with catheter insertion and his spraying had significantly improved though starting to recur  PSA 04/10/2020 remains undetectable <0.1   PMH: Past Medical History:  Diagnosis Date  . Colon polyp 2008  . Diverticulosis   . Emphysema lung ([place])   . GERD (gastroesophageal reflux disease)   . Gilbert syndrome   . Gout   . Gout   . Hyperlipidemia   . Peptic ulcer disease   . Pre-diabetes   . Prostate CA ([place]) 09/2015  . Tubular adenoma of colon 2016    Surgical History: Past Surgical History:  Procedure Laterality Date  . COLONOSCOPY     2008, 2011, 2016  . COLONOSCOPY WITH PROPOFOL N/A 04/06/2020   Procedure: COLONOSCOPY WITH PROPOFOL;  Surgeon: Lesly Rubenstein, MD;  Location: [facility] ENDOSCOPY;  Service: Endoscopy;  Laterality: N/A;  . HERNIA REPAIR    . ROBOTIC LAPAROSCOPY PROSTECTOMY  2017   AND INGUINAL HERNIA REPAIR  . TONSILLECTOMY    . XI ROBOTIC ASSISTED INGUINAL HERNIA REPAIR WITH MESH Left 02/20/2020   Procedure: XI ROBOTIC ASSISTED INGUINAL HERNIA REPAIR WITH MESH;  Surgeon: Herbert Pun, MD;  Location: [facility] ORS;   Service: General;  Laterality: Left;    Home Medications:  Allergies as of 04/11/2020   No Known Allergies     Medication List       Accurate as of April 11, 2020  9:13 AM. If you have any questions, ask your nurse or doctor.        STOP taking these medications   ibuprofen 200 MG tablet Commonly known as: ADVIL Stopped by: Abbie Sons, MD   naproxen sodium 220 MG tablet Commonly known as: ALEVE Stopped by: Abbie Sons, MD   solifenacin 10 MG tablet Commonly known as: VESICARE Stopped by: Abbie Sons, MD     TAKE these medications   aspirin 81 MG EC tablet Take 81 mg by mouth daily.   CALCIUM 1000 + D PO Take 1 tablet by mouth daily.   cholecalciferol 1000 units tablet Commonly known as: VITAMIN D Take 1,000 Units by mouth daily.   Fish Oil 1000 MG Caps Take 1,000 mg by mouth daily.   fluticasone 50 MCG/ACT nasal spray Commonly known as: FLONASE Place 2 sprays into both nostrils daily.   lansoprazole 15 MG capsule Commonly known as: PREVACID Take 15 mg by mouth daily.   LUBRICATING EYE DROPS OP Place 1 drop into both eyes daily.   multivitamin tablet Take 1 tablet by mouth daily.   simvastatin 40 MG tablet Commonly known as: ZOCOR Take 40 mg by mouth daily.   vitamin B-12 1000 MCG tablet Commonly known as: CYANOCOBALAMIN Take 1,000 mcg by mouth daily.  Allergies: No Known Allergies  Family History: Family History  Problem Relation Age of Onset  . Bladder Cancer Neg Hx   . Kidney cancer Neg Hx   . Prostate cancer Neg Hx     Social History:  reports that he has quit smoking. He has quit using smokeless tobacco. He reports current alcohol use of about 2.0 standard drinks of alcohol per week. He reports that he does not use drugs.   Physical Exam: BP (!) 158/82   Pulse 90   Ht 5\' 9"  (1.753 m)   Wt 180 lb (81.6 kg)   BMI 26.58 kg/m   Constitutional:  Alert and oriented, No acute distress. HEENT: [place] AT, moist mucus  membranes.  Trachea midline, no masses. Cardiovascular: No clubbing, cyanosis, or edema. Respiratory: Normal respiratory effort, no increased work of breathing. Neurologic: Grossly intact, no focal deficits, moving all 4 extremities. Psychiatric: Normal mood and affect.   Assessment & Plan:    1. pT2c prostate cancer  Radical prostatectomy 4.5 years ago  PSA remains undetectable  Follow-up 6 months and annual follow-up after next visit  2.  Overactive bladder  No improvement on Myrbetriq or solifenacin  Was interested in trial Gemtesa 75 mg and samples given  3.  Meatal stenosis  Symptoms were improved after catheterization for his hernia repair  He does desire to schedule an office meatal dilation and will place on periodic self dilation after this is performed    Abbie Sons, [place] [address] [place], [place] [address] [phone]

## 2020-04-20 ENCOUNTER — Ambulatory Visit: Payer: Self-pay | Admitting: Urology

## 2020-04-25 ENCOUNTER — Encounter: Payer: Self-pay | Admitting: Urology

## 2020-07-02 ENCOUNTER — Ambulatory Visit: Payer: Self-pay | Admitting: Urology

## 2020-08-22 DIAGNOSIS — I700 Atherosclerosis of aorta: Secondary | ICD-10-CM | POA: Diagnosis not present

## 2020-08-22 DIAGNOSIS — E782 Mixed hyperlipidemia: Secondary | ICD-10-CM | POA: Diagnosis not present

## 2020-08-22 DIAGNOSIS — R7303 Prediabetes: Secondary | ICD-10-CM | POA: Diagnosis not present

## 2020-08-28 DIAGNOSIS — I700 Atherosclerosis of aorta: Secondary | ICD-10-CM | POA: Diagnosis not present

## 2020-08-28 DIAGNOSIS — Z0000 Encounter for general adult medical examination without abnormal findings: Secondary | ICD-10-CM | POA: Diagnosis not present

## 2020-08-28 DIAGNOSIS — J438 Other emphysema: Secondary | ICD-10-CM | POA: Diagnosis not present

## 2020-08-28 DIAGNOSIS — E782 Mixed hyperlipidemia: Secondary | ICD-10-CM | POA: Diagnosis not present

## 2020-08-28 DIAGNOSIS — R7303 Prediabetes: Secondary | ICD-10-CM | POA: Diagnosis not present

## 2020-09-04 ENCOUNTER — Other Ambulatory Visit: Payer: Self-pay | Admitting: *Deleted

## 2020-09-04 DIAGNOSIS — C61 Malignant neoplasm of prostate: Secondary | ICD-10-CM

## 2020-10-01 ENCOUNTER — Other Ambulatory Visit: Payer: PPO

## 2020-10-01 ENCOUNTER — Other Ambulatory Visit: Payer: Self-pay | Admitting: *Deleted

## 2020-10-01 ENCOUNTER — Other Ambulatory Visit: Payer: Self-pay

## 2020-10-01 DIAGNOSIS — C61 Malignant neoplasm of prostate: Secondary | ICD-10-CM | POA: Diagnosis not present

## 2020-10-01 DIAGNOSIS — Z87891 Personal history of nicotine dependence: Secondary | ICD-10-CM

## 2020-10-02 LAB — PSA: Prostate Specific Ag, Serum: <0.1 ng/mL (ref 0.0–4.0)

## 2020-10-04 ENCOUNTER — Encounter: Payer: Self-pay | Admitting: Urology

## 2020-10-04 ENCOUNTER — Other Ambulatory Visit: Payer: Self-pay

## 2020-10-04 ENCOUNTER — Ambulatory Visit: Payer: PPO | Admitting: Urology

## 2020-10-04 VITALS — BP 123/85 | HR 87 | Ht 69.0 in | Wt 180.0 lb

## 2020-10-04 DIAGNOSIS — R351 Nocturia: Secondary | ICD-10-CM | POA: Diagnosis not present

## 2020-10-04 DIAGNOSIS — C61 Malignant neoplasm of prostate: Secondary | ICD-10-CM | POA: Diagnosis not present

## 2020-10-04 DIAGNOSIS — N35010 Post-traumatic urethral stricture, male, meatal: Secondary | ICD-10-CM

## 2020-10-04 MED ORDER — OXYBUTYNIN CHLORIDE 5 MG PO TABS: ORAL_TABLET | ORAL | 0 refills | Status: AC

## 2020-10-04 NOTE — Progress Notes (Signed)
10/04/2020 9:22 AM   David Mccoy 05-06-1950 [id number]  Referring provider: Dion Body, MD [facility] [place],  [place] [address]  Chief Complaint  Patient presents with   Prostate Cancer   Urologic history: 1. pT2c N0 M0 adenocarcinoma prostate             - [facility]             -Gleason 3+4 with positive unifocal apical margin             -Declined adjuvant radiation             -PSA has remained undetectable  HPI: 70 y.o. male presents for 70-monthfollow-up.  Has noted worsening spraying of his urinary stream and elected meatal dilation as we had previously discussed Does have nocturia x4.  Previously tried Myrbetriq and Solifenacin without improvement No history of sleep apnea or snoring PSA 10/01/2020 remains undetectable at < 0.1   PMH: Past Medical History:  Diagnosis Date   Colon polyp 2008   Diverticulosis    Emphysema lung ([facility])    GERD (gastroesophageal reflux disease)    Gilbert syndrome    Gout    Gout    Hyperlipidemia    Peptic ulcer disease    Pre-diabetes    [place]state [place] 09/2015   Tubular adenoma of colon 2016    Surgical History: Past Surgical History:  Procedure Laterality Date   COLONOSCOPY     2008, 2011, 2016   COLONOSCOPY WITH PROPOFOL N/A 04/06/2020   Procedure: COLONOSCOPY WITH PROPOFOL;  Surgeon: LLesly Rubenstein MD;  Location: [facility] ENDOSCOPY;  Service: Endoscopy;  Laterality: N/A;   HERNIA REPAIR     ROBOTIC LAPAROSCOPY PROSTECTOMY  2017   AND INGUINAL HERNIA REPAIR   TONSILLECTOMY     XI ROBOTIC ASSISTED INGUINAL HERNIA REPAIR WITH MESH Left 02/20/2020   Procedure: XI ROBOTIC ASSISTED INGUINAL HERNIA REPAIR WITH MESH;  Surgeon: CHerbert Pun MD;  Location: [facility] ORS;  Service: General;  Laterality: Left;    Home Medications:  Allergies as of 10/04/2020   No Known Allergies      Medication List        Accurate as of October 04, 2020  9:22 AM. If you  have any questions, ask your nurse or doctor.          aspirin 81 MG EC tablet Take 81 mg by mouth daily.   CALCIUM 1000 + D PO Take 1 tablet by mouth daily.   cholecalciferol 1000 units tablet Commonly known as: VITAMIN D Take 1,000 Units by mouth daily.   Fish Oil 1000 MG Caps Take 1,000 mg by mouth daily.   fluticasone 50 MCG/ACT nasal spray Commonly known as: FLONASE Place 2 sprays into both nostrils daily.   lansoprazole 15 MG capsule Commonly known as: PREVACID Take 15 mg by mouth daily.   LUBRICATING EYE DROPS OP Place 1 drop into both eyes daily.   multivitamin tablet Take 1 tablet by mouth daily.   oxybutynin 5 MG tablet Commonly known as: DITROPAN Take 1 hour before bed. Started by: SAbbie Sons MD   simvastatin 40 MG tablet Commonly known as: ZOCOR Take 40 mg by mouth daily.   vitamin B-12 1000 MCG tablet Commonly known as: CYANOCOBALAMIN Take 1,000 mcg by mouth daily.        Allergies: No Known Allergies  Family History: Family History  Problem Relation Age of Onset  Bladder Cancer Neg Hx    Kidney cancer Neg Hx    Prostate cancer Neg Hx     Social History:  reports that he has quit smoking. He has quit using smokeless tobacco. He reports current alcohol use of about 2.0 standard drinks per week. He reports that he does not use drugs.   Physical Exam: BP 123/85   Pulse 87   Ht '5\' 9"'$  (1.753 m)   Wt 180 lb (81.6 kg)   BMI 26.58 kg/m   Constitutional:  Alert and oriented, No acute distress. HEENT: [place] AT, moist mucus membranes.  Trachea midline, no masses. Cardiovascular: No clubbing, cyanosis, or edema. Respiratory: Normal respiratory effort, no increased work of breathing. GI: Abdomen is soft, nontender, nondistended, no abdominal masses GU: Small meatus  Lidocaine gel was instilled per urethra.  Meatus was dilated with Leander Rams sounds 10-18 French   Assessment & Plan:    1.  Meatal stenosis If persistent symptoms  after dilation we discussed possibility of proximal stricture and would recommend cystoscopy  2.  Nocturia Trial immediate release oxybutynin 5 mg 1 hour prior to bedtime Sleep study for persistent symptoms  3.  History of prostate cancer Undetectable PSA 5 years out from radical prostatectomy Annual follow-up with [place], MD  [place] [address] [place], [place] [address] [phone]

## 2020-10-05 ENCOUNTER — Ambulatory Visit
Admission: RE | Admit: 2020-10-05 | Discharge: 2020-10-05 | Disposition: A | Discharge status: Home / Self Care | Payer: PPO | Source: Ambulatory Visit | Attending: Acute Care | Admitting: Acute Care

## 2020-10-05 DIAGNOSIS — Z87891 Personal history of nicotine dependence: Secondary | ICD-10-CM | POA: Diagnosis not present

## 2020-10-08 ENCOUNTER — Other Ambulatory Visit: Payer: Self-pay

## 2020-10-12 ENCOUNTER — Ambulatory Visit: Payer: Self-pay | Admitting: Urology

## 2020-10-20 NOTE — Progress Notes (Signed)
Please call patient and let them  know their  low dose Ct was read as a Lung RADS 2: nodules that are benign in appearance and behavior with a very low likelihood of becoming a clinically active cancer due to size or lack of growth. Recommendation per radiology is for a repeat LDCT in 12 months. .Please let them  know we will order and schedule their  annual screening scan for 09/2021. Please let them  know there was notation of CAD on their  scan.  Please remind the patient  that this is a non-gated exam therefore degree or severity of disease  cannot be determined. Please have them  follow up with their PCP regarding potential risk factor modification, dietary therapy or pharmacologic therapy if clinically indicated. Pt.  is  currently on statin therapy. Please place order for annual  screening scan for  09/2021 and fax results to PCP. Thanks so much.  There was notation of Ascending aortic aneurysm, that is unchanged, and will be followed annually. In addition there was notation of aortic atherosclerosis and CA calcification. The patient is on statin. I do not see cards notes in epic. Have them follow up with PCP. Thanks so much

## 2020-10-23 ENCOUNTER — Encounter: Payer: Self-pay | Admitting: *Deleted

## 2020-10-23 DIAGNOSIS — Z87891 Personal history of nicotine dependence: Secondary | ICD-10-CM

## 2020-12-04 DIAGNOSIS — L218 Other seborrheic dermatitis: Secondary | ICD-10-CM | POA: Diagnosis not present

## 2020-12-04 DIAGNOSIS — X32XXXA Exposure to sunlight, initial encounter: Secondary | ICD-10-CM | POA: Diagnosis not present

## 2020-12-04 DIAGNOSIS — D225 Melanocytic nevi of trunk: Secondary | ICD-10-CM | POA: Diagnosis not present

## 2020-12-04 DIAGNOSIS — D2271 Melanocytic nevi of right lower limb, including hip: Secondary | ICD-10-CM | POA: Diagnosis not present

## 2020-12-04 DIAGNOSIS — D2261 Melanocytic nevi of right upper limb, including shoulder: Secondary | ICD-10-CM | POA: Diagnosis not present

## 2020-12-04 DIAGNOSIS — L821 Other seborrheic keratosis: Secondary | ICD-10-CM | POA: Diagnosis not present

## 2020-12-04 DIAGNOSIS — L570 Actinic keratosis: Secondary | ICD-10-CM | POA: Diagnosis not present

## 2020-12-04 DIAGNOSIS — D2262 Melanocytic nevi of left upper limb, including shoulder: Secondary | ICD-10-CM | POA: Diagnosis not present

## 2020-12-04 DIAGNOSIS — D2272 Melanocytic nevi of left lower limb, including hip: Secondary | ICD-10-CM | POA: Diagnosis not present

## 2021-01-01 DIAGNOSIS — E782 Mixed hyperlipidemia: Secondary | ICD-10-CM | POA: Diagnosis not present

## 2021-01-01 DIAGNOSIS — R7303 Prediabetes: Secondary | ICD-10-CM | POA: Diagnosis not present

## 2021-01-08 DIAGNOSIS — R7303 Prediabetes: Secondary | ICD-10-CM | POA: Diagnosis not present

## 2021-01-08 DIAGNOSIS — E782 Mixed hyperlipidemia: Secondary | ICD-10-CM | POA: Diagnosis not present

## 2021-01-08 DIAGNOSIS — K219 Gastro-esophageal reflux disease without esophagitis: Secondary | ICD-10-CM | POA: Diagnosis not present

## 2021-04-08 ENCOUNTER — Other Ambulatory Visit: Payer: Self-pay

## 2021-04-10 ENCOUNTER — Ambulatory Visit: Payer: Self-pay | Admitting: Urology

## 2021-06-11 DIAGNOSIS — H43812 Vitreous degeneration, left eye: Secondary | ICD-10-CM | POA: Diagnosis not present

## 2021-06-11 DIAGNOSIS — H25013 Cortical age-related cataract, bilateral: Secondary | ICD-10-CM | POA: Diagnosis not present

## 2021-06-11 DIAGNOSIS — H43811 Vitreous degeneration, right eye: Secondary | ICD-10-CM | POA: Diagnosis not present

## 2021-06-27 DIAGNOSIS — H9202 Otalgia, left ear: Secondary | ICD-10-CM | POA: Diagnosis not present

## 2021-07-03 DIAGNOSIS — E782 Mixed hyperlipidemia: Secondary | ICD-10-CM | POA: Diagnosis not present

## 2021-07-03 DIAGNOSIS — R7303 Prediabetes: Secondary | ICD-10-CM | POA: Diagnosis not present

## 2021-07-10 DIAGNOSIS — I700 Atherosclerosis of aorta: Secondary | ICD-10-CM | POA: Diagnosis not present

## 2021-07-10 DIAGNOSIS — J438 Other emphysema: Secondary | ICD-10-CM | POA: Diagnosis not present

## 2021-07-10 DIAGNOSIS — K219 Gastro-esophageal reflux disease without esophagitis: Secondary | ICD-10-CM | POA: Diagnosis not present

## 2021-07-10 DIAGNOSIS — E1169 Type 2 diabetes mellitus with other specified complication: Secondary | ICD-10-CM | POA: Diagnosis not present

## 2021-07-10 DIAGNOSIS — E782 Mixed hyperlipidemia: Secondary | ICD-10-CM | POA: Diagnosis not present

## 2021-07-10 DIAGNOSIS — E785 Hyperlipidemia, unspecified: Secondary | ICD-10-CM | POA: Diagnosis not present

## 2021-08-12 DIAGNOSIS — H903 Sensorineural hearing loss, bilateral: Secondary | ICD-10-CM | POA: Diagnosis not present

## 2021-09-09 DIAGNOSIS — H25013 Cortical age-related cataract, bilateral: Secondary | ICD-10-CM | POA: Diagnosis not present

## 2021-09-09 DIAGNOSIS — H43811 Vitreous degeneration, right eye: Secondary | ICD-10-CM | POA: Diagnosis not present

## 2021-09-09 DIAGNOSIS — H5213 Myopia, bilateral: Secondary | ICD-10-CM | POA: Diagnosis not present

## 2021-09-09 DIAGNOSIS — H353131 Nonexudative age-related macular degeneration, bilateral, early dry stage: Secondary | ICD-10-CM | POA: Diagnosis not present

## 2021-09-09 DIAGNOSIS — H43812 Vitreous degeneration, left eye: Secondary | ICD-10-CM | POA: Diagnosis not present

## 2021-10-04 ENCOUNTER — Other Ambulatory Visit: Payer: PPO

## 2021-10-04 DIAGNOSIS — C61 Malignant neoplasm of prostate: Secondary | ICD-10-CM | POA: Diagnosis not present

## 2021-10-05 LAB — PSA: Prostate Specific Ag, Serum: <0.1 ng/mL (ref 0.0–4.0)

## 2021-10-07 ENCOUNTER — Other Ambulatory Visit: Payer: Self-pay

## 2021-10-07 ENCOUNTER — Ambulatory Visit
Admission: RE | Admit: 2021-10-07 | Discharge: 2021-10-07 | Disposition: A | Discharge status: Home / Self Care | Payer: PPO | Source: Ambulatory Visit | Attending: Acute Care | Admitting: Acute Care

## 2021-10-07 DIAGNOSIS — I700 Atherosclerosis of aorta: Secondary | ICD-10-CM | POA: Insufficient documentation

## 2021-10-07 DIAGNOSIS — Z87891 Personal history of nicotine dependence: Secondary | ICD-10-CM | POA: Diagnosis not present

## 2021-10-07 DIAGNOSIS — Z122 Encounter for screening for malignant neoplasm of respiratory organs: Secondary | ICD-10-CM | POA: Diagnosis not present

## 2021-10-07 DIAGNOSIS — J439 Emphysema, unspecified: Secondary | ICD-10-CM | POA: Diagnosis not present

## 2021-10-07 DIAGNOSIS — I2510 Atherosclerotic heart disease of native coronary artery without angina pectoris: Secondary | ICD-10-CM | POA: Insufficient documentation

## 2021-10-09 ENCOUNTER — Encounter: Payer: Self-pay | Admitting: Urology

## 2021-10-09 ENCOUNTER — Ambulatory Visit: Payer: PPO | Admitting: Urology

## 2021-10-09 VITALS — BP 114/75 | HR 82 | Ht 68.0 in | Wt 175.0 lb

## 2021-10-09 DIAGNOSIS — R351 Nocturia: Secondary | ICD-10-CM

## 2021-10-09 DIAGNOSIS — Z8546 Personal history of malignant neoplasm of prostate: Secondary | ICD-10-CM

## 2021-10-09 DIAGNOSIS — N5231 Erectile dysfunction following radical prostatectomy: Secondary | ICD-10-CM

## 2021-10-09 MED ORDER — TADALAFIL 5 MG PO TABS: 5.0000 mg | ORAL_TABLET | Freq: Every day | ORAL | 0 refills | Status: AC | PRN

## 2021-10-09 NOTE — Progress Notes (Signed)
10/09/2021 9:20 AM   David Mccoy 03-15-1950 [id number]  Referring provider: Dion Body, MD [facility] [place],  [place] [address]  Chief Complaint  Patient presents with   Prostate Cancer   Urologic history: 1. pT2c N0 M0 adenocarcinoma prostate             - RALP at [facility] 09/2015             -Gleason 3+4 with positive unifocal apical margin             -Declined adjuvant radiation             -PSA has remained undetectable  HPI: 71 y.o. male presents for annual follow-up.  Doing well since last visit Stable LUTS Denies dysuria, gross hematuria Denies flank, abdominal or pelvic pain PSA 10/04/2021 remains undetectable at < 0.1 Postop ED after surgery.  PDE 5 inhibitors and intracavernosal injections were not effective He would like a repeat trial of low-dose PDE 5 inhibitor   PMH: Past Medical History:  Diagnosis Date   Colon polyp 2008   Diverticulosis    Emphysema lung (HCC)    GERD (gastroesophageal reflux disease)    Gilbert syndrome    Gout    Gout    Hyperlipidemia    Peptic ulcer disease    Pre-diabetes    Prostate CA ([place]) 09/2015   Tubular adenoma of colon 2016    Surgical History: Past Surgical History:  Procedure Laterality Date   COLONOSCOPY     2008, 2011, 2016   COLONOSCOPY WITH PROPOFOL N/A 04/06/2020   Procedure: COLONOSCOPY WITH PROPOFOL;  Surgeon: Lesly Rubenstein, MD;  Location: [facility] ENDOSCOPY;  Service: Endoscopy;  Laterality: N/A;   HERNIA REPAIR     ROBOTIC LAPAROSCOPY PROSTECTOMY  2017   AND INGUINAL HERNIA REPAIR   TONSILLECTOMY     XI ROBOTIC ASSISTED INGUINAL HERNIA REPAIR WITH MESH Left 02/20/2020   Procedure: XI ROBOTIC ASSISTED INGUINAL HERNIA REPAIR WITH MESH;  Surgeon: Herbert Pun, MD;  Location: [facility] ORS;  Service: General;  Laterality: Left;    Home Medications:  Allergies as of 10/09/2021   No Known Allergies      Medication List        Accurate as of  October 09, 2021  9:20 AM. If you have any questions, ask your nurse or doctor.          aspirin EC 81 MG tablet Take 81 mg by mouth daily.   CALCIUM 1000 + D PO Take 1 tablet by mouth daily.   cholecalciferol 1000 units tablet Commonly known as: VITAMIN D Take 1,000 Units by mouth daily.   cyanocobalamin 1000 MCG tablet Commonly known as: VITAMIN B12 Take 1,000 mcg by mouth daily.   Fish Oil 1000 MG Caps Take 1,000 mg by mouth daily.   fluticasone 50 MCG/ACT nasal spray Commonly known as: FLONASE Place 2 sprays into both nostrils daily.   lansoprazole 15 MG capsule Commonly known as: PREVACID Take 15 mg by mouth daily.   LUBRICATING EYE DROPS OP Place 1 drop into both eyes daily.   multivitamin tablet Take 1 tablet by mouth daily.   oxybutynin 5 MG tablet Commonly known as: DITROPAN Take 1 hour before bed.   simvastatin 40 MG tablet Commonly known as: ZOCOR Take 40 mg by mouth daily.        Allergies: No Known Allergies  Family History: Family History  Problem Relation Age of Onset  Bladder Cancer Neg Hx    Kidney cancer Neg Hx    Prostate cancer Neg Hx     Social History:  reports that he has quit smoking. He has quit using smokeless tobacco. He reports current alcohol use of about 2.0 standard drinks of alcohol per week. He reports that he does not use drugs.   Physical Exam: BP 114/75   Pulse 82   Ht '5\' 8"'$  (1.727 m)   Wt 175 lb (79.4 kg)   BMI 26.61 kg/m   Constitutional:  Alert and oriented, No acute distress. HEENT: [place] AT Respiratory: Normal respiratory effort, no increased work of breathing.   Assessment & Plan:    1.  History prostate cancer PSA remains undetectable Lab visit 1 year with PSA  2.  Nocturia Nocturia x4 Not instructed in pursuing a sleep study at this time  3.  Erectile dysfunction Rx tadalafil 5 mg sent to [facility], [place] [address], [place], [place] [address] [phone]

## 2021-10-16 DIAGNOSIS — E782 Mixed hyperlipidemia: Secondary | ICD-10-CM | POA: Diagnosis not present

## 2021-10-16 DIAGNOSIS — E785 Hyperlipidemia, unspecified: Secondary | ICD-10-CM | POA: Diagnosis not present

## 2021-10-16 DIAGNOSIS — E1169 Type 2 diabetes mellitus with other specified complication: Secondary | ICD-10-CM | POA: Diagnosis not present

## 2021-10-19 IMAGING — CT CT CHEST LUNG CANCER SCREENING LOW DOSE W/O CM
1 series · 10 of 10 positions shown, 13 images · non-contrast
Comparison: 08/15/2019.

CLINICAL DATA: Former smoker, quit 14 years ago, 37 pack-year
history.

EXAM:
CT CHEST WITHOUT CONTRAST LOW-DOSE FOR LUNG CANCER SCREENING
TECHNIQUE: Multidetector CT imaging of the chest was performed following the
standard protocol without IV contrast.

[ct lung segmentation data · axial · 0.68mm/px · z∈[-1333,-1333]mm · 10 of 374 frames shown]
[frame 1/374  mediastinal]
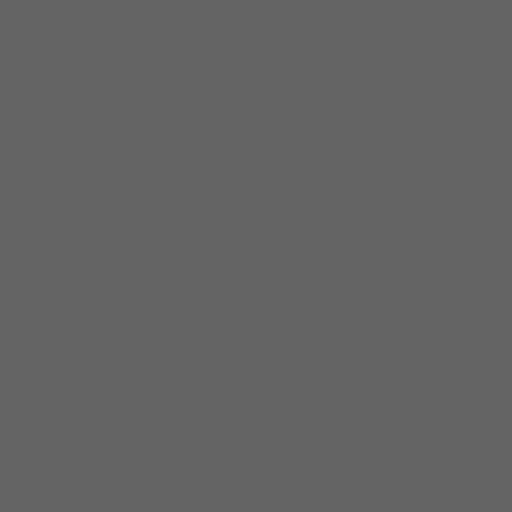
[frame 1/374  lung]
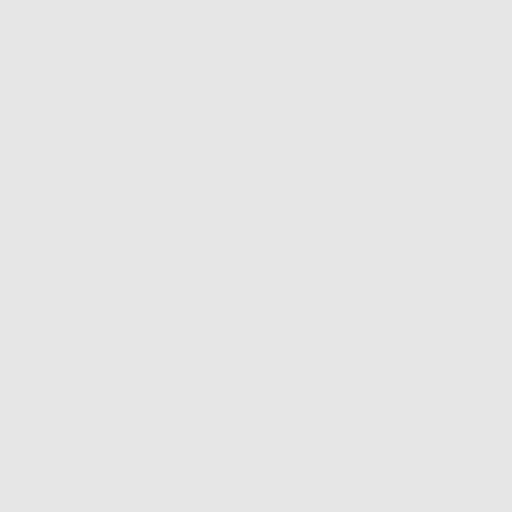
[frame 42/374  lung]
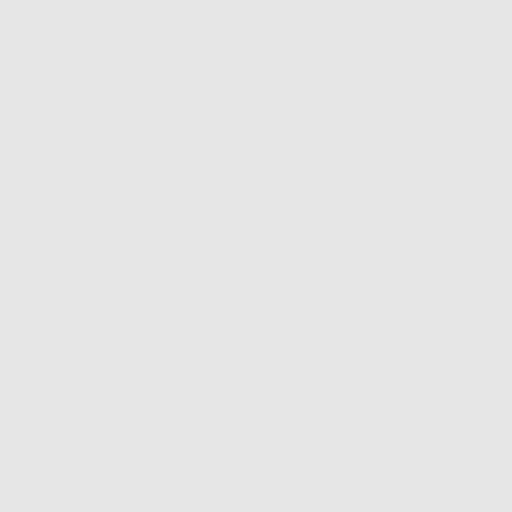
[frame 83/374  lung]
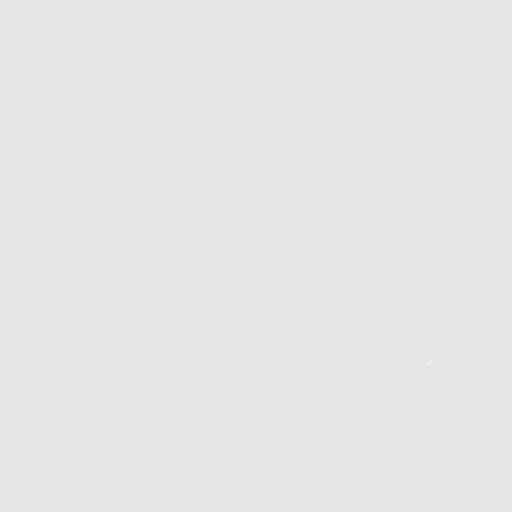
[frame 125/374  lung]
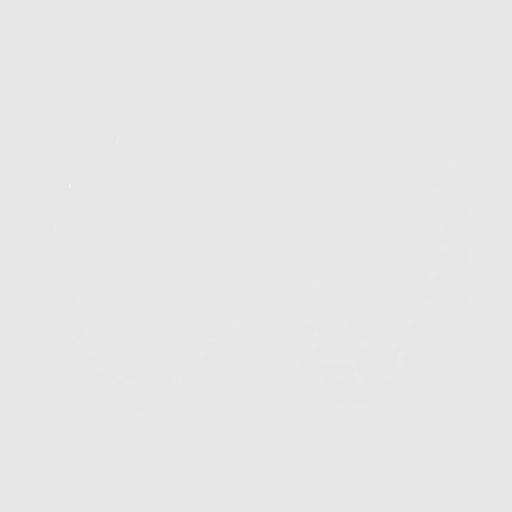
[frame 166/374  mediastinal]
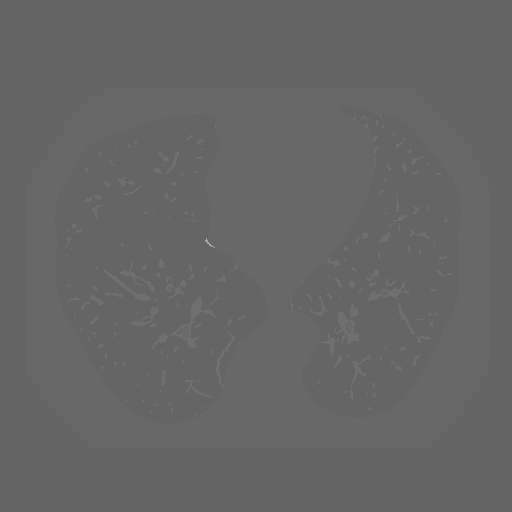
[frame 166/374  lung]
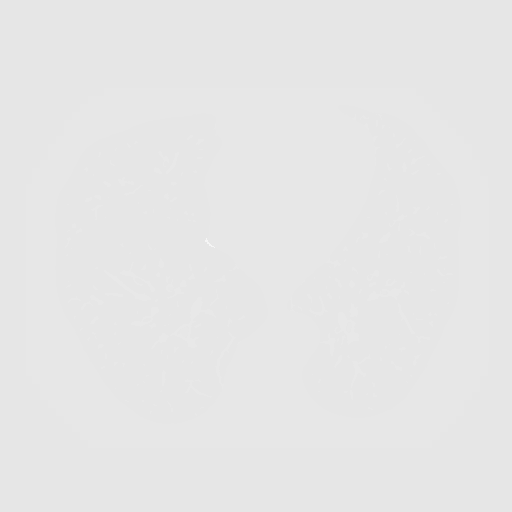
[frame 208/374  lung]
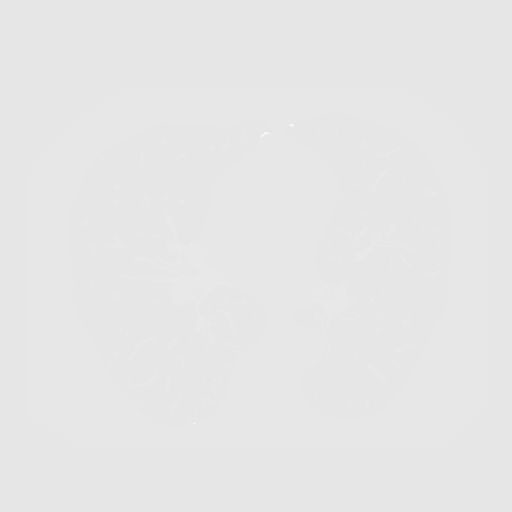
[frame 249/374  lung]
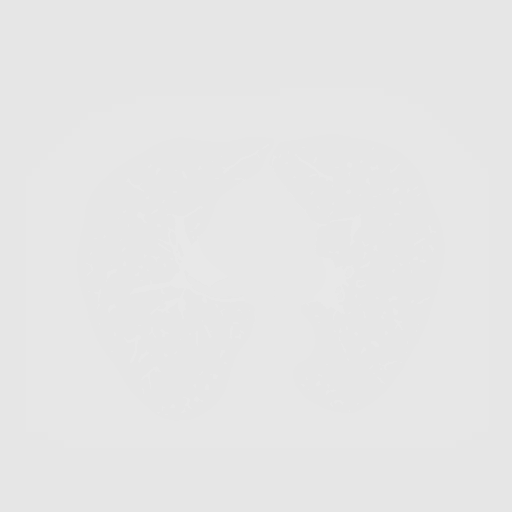
[frame 291/374  lung]
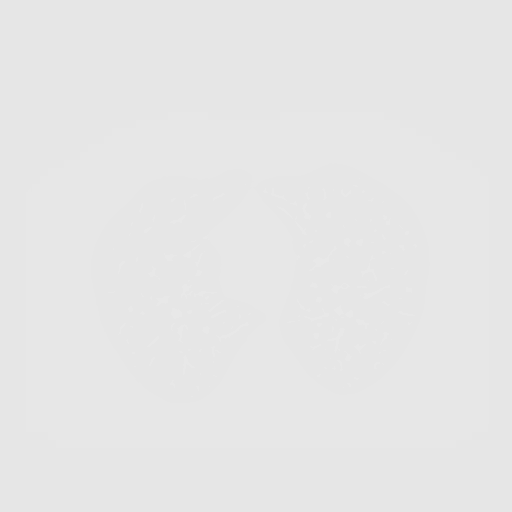
[frame 332/374  mediastinal]
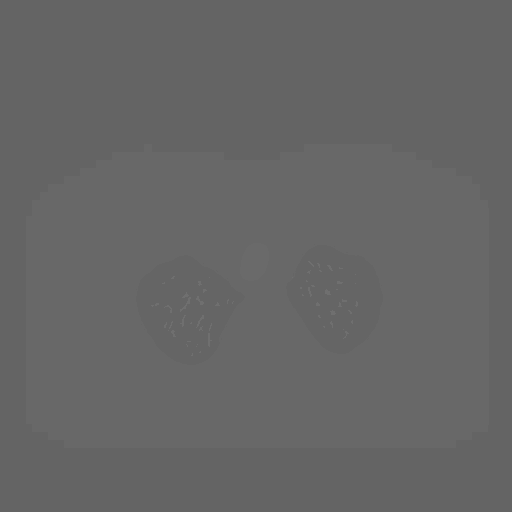
[frame 332/374  lung]
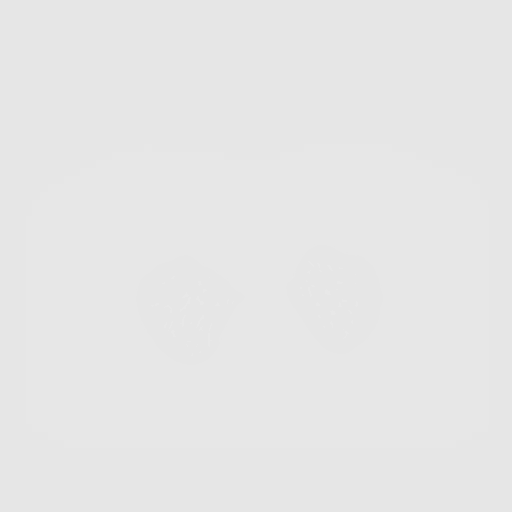
[frame 374/374  lung]
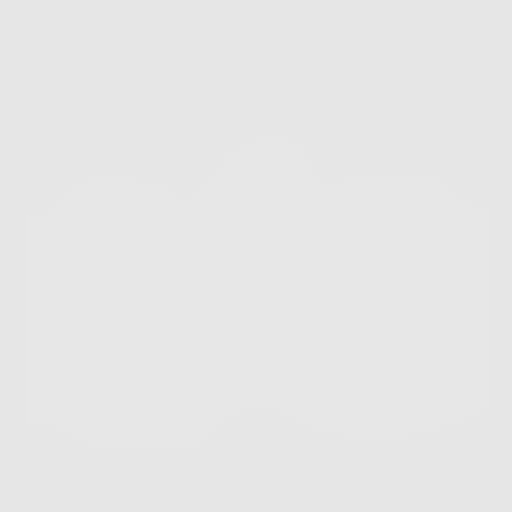

[10 of 10 positions shown; findings below may reference images not displayed]

FINDINGS: Cardiovascular: Atherosclerotic calcification of the aorta and
coronary arteries. Ascending aorta measures up to 4.1 cm (coronal
image 125), stable. Heart size normal. No pericardial effusion.

Mediastinum/Nodes: No pathologically enlarged mediastinal or
axillary lymph nodes. Hilar regions are difficult to definitively
evaluate without IV contrast. Esophagus is grossly unremarkable.

Lungs/Pleura: Centrilobular emphysema. Minimal scattered pulmonary
parenchymal scarring. Calcified granulomas. Pulmonary nodules
measure 3.7 mm or less in size, unchanged. No suspicious pulmonary
nodules. No pleural fluid. Airway is unremarkable.

Upper Abdomen: Visualized portions of the liver, gallbladder,
adrenal glands and right kidney are unremarkable. Stones in the left
kidney. Visualized portions of the spleen, pancreas, stomach and
bowel are grossly unremarkable.

Musculoskeletal: Degenerative changes in the spine. No worrisome
lytic or sclerotic lesions.
IMPRESSION: 1. Lung-RADS 2, benign appearance or behavior. Continue annual
screening with low-dose chest CT without contrast in 12 months.
2. Ascending aortic aneurysm, unchanged. Recommend annual imaging
followup by CTA or MRA. This recommendation follows 5717
ACCF/AHA/AATS/ACR/ASA/SCA/JESUS DAMIAN/FRANZ LUIS/UTAN/SANJAEN Guidelines for the
Diagnosis and Management of Patients with Thoracic Aortic Disease.
Circulation. 5717; 121: E266-e369. Aortic aneurysm NOS
(JXMJO-ZOD.I).
3. Left renal stones.
4. Aortic atherosclerosis (JXMJO-NQC.C). Coronary artery
calcification.
5.  Emphysema (JXMJO-OZO.Y).

## 2021-10-23 DIAGNOSIS — Z8546 Personal history of malignant neoplasm of prostate: Secondary | ICD-10-CM | POA: Diagnosis not present

## 2021-10-23 DIAGNOSIS — E1169 Type 2 diabetes mellitus with other specified complication: Secondary | ICD-10-CM | POA: Diagnosis not present

## 2021-10-23 DIAGNOSIS — E782 Mixed hyperlipidemia: Secondary | ICD-10-CM | POA: Diagnosis not present

## 2021-10-23 DIAGNOSIS — E785 Hyperlipidemia, unspecified: Secondary | ICD-10-CM | POA: Diagnosis not present

## 2021-11-12 DIAGNOSIS — H6092 Unspecified otitis externa, left ear: Secondary | ICD-10-CM | POA: Diagnosis not present

## 2021-11-12 DIAGNOSIS — H60332 Swimmer's ear, left ear: Secondary | ICD-10-CM | POA: Diagnosis not present

## 2021-11-25 DIAGNOSIS — H60332 Swimmer's ear, left ear: Secondary | ICD-10-CM | POA: Diagnosis not present

## 2021-12-04 DIAGNOSIS — D2262 Melanocytic nevi of left upper limb, including shoulder: Secondary | ICD-10-CM | POA: Diagnosis not present

## 2021-12-04 DIAGNOSIS — L570 Actinic keratosis: Secondary | ICD-10-CM | POA: Diagnosis not present

## 2021-12-04 DIAGNOSIS — L821 Other seborrheic keratosis: Secondary | ICD-10-CM | POA: Diagnosis not present

## 2021-12-04 DIAGNOSIS — D2272 Melanocytic nevi of left lower limb, including hip: Secondary | ICD-10-CM | POA: Diagnosis not present

## 2021-12-04 DIAGNOSIS — D2271 Melanocytic nevi of right lower limb, including hip: Secondary | ICD-10-CM | POA: Diagnosis not present

## 2021-12-04 DIAGNOSIS — D2261 Melanocytic nevi of right upper limb, including shoulder: Secondary | ICD-10-CM | POA: Diagnosis not present

## 2022-01-22 DIAGNOSIS — Z125 Encounter for screening for malignant neoplasm of prostate: Secondary | ICD-10-CM | POA: Diagnosis not present

## 2022-01-22 DIAGNOSIS — E782 Mixed hyperlipidemia: Secondary | ICD-10-CM | POA: Diagnosis not present

## 2022-01-22 DIAGNOSIS — Z8546 Personal history of malignant neoplasm of prostate: Secondary | ICD-10-CM | POA: Diagnosis not present

## 2022-01-22 DIAGNOSIS — E1169 Type 2 diabetes mellitus with other specified complication: Secondary | ICD-10-CM | POA: Diagnosis not present

## 2022-01-22 DIAGNOSIS — E785 Hyperlipidemia, unspecified: Secondary | ICD-10-CM | POA: Diagnosis not present

## 2022-01-29 DIAGNOSIS — E785 Hyperlipidemia, unspecified: Secondary | ICD-10-CM | POA: Diagnosis not present

## 2022-01-29 DIAGNOSIS — Z8546 Personal history of malignant neoplasm of prostate: Secondary | ICD-10-CM | POA: Diagnosis not present

## 2022-01-29 DIAGNOSIS — E782 Mixed hyperlipidemia: Secondary | ICD-10-CM | POA: Diagnosis not present

## 2022-01-29 DIAGNOSIS — E1169 Type 2 diabetes mellitus with other specified complication: Secondary | ICD-10-CM | POA: Diagnosis not present

## 2022-03-11 DIAGNOSIS — E1169 Type 2 diabetes mellitus with other specified complication: Secondary | ICD-10-CM | POA: Diagnosis not present

## 2022-03-11 DIAGNOSIS — J40 Bronchitis, not specified as acute or chronic: Secondary | ICD-10-CM | POA: Diagnosis not present

## 2022-03-11 DIAGNOSIS — J438 Other emphysema: Secondary | ICD-10-CM | POA: Diagnosis not present

## 2022-03-11 DIAGNOSIS — E785 Hyperlipidemia, unspecified: Secondary | ICD-10-CM | POA: Diagnosis not present

## 2022-06-19 DIAGNOSIS — L718 Other rosacea: Secondary | ICD-10-CM | POA: Diagnosis not present

## 2022-06-19 DIAGNOSIS — L578 Other skin changes due to chronic exposure to nonionizing radiation: Secondary | ICD-10-CM | POA: Diagnosis not present

## 2022-06-25 DIAGNOSIS — H43812 Vitreous degeneration, left eye: Secondary | ICD-10-CM | POA: Diagnosis not present

## 2022-06-25 DIAGNOSIS — H5213 Myopia, bilateral: Secondary | ICD-10-CM | POA: Diagnosis not present

## 2022-06-25 DIAGNOSIS — H25013 Cortical age-related cataract, bilateral: Secondary | ICD-10-CM | POA: Diagnosis not present

## 2022-06-25 DIAGNOSIS — H353131 Nonexudative age-related macular degeneration, bilateral, early dry stage: Secondary | ICD-10-CM | POA: Diagnosis not present

## 2022-06-25 DIAGNOSIS — H43811 Vitreous degeneration, right eye: Secondary | ICD-10-CM | POA: Diagnosis not present

## 2022-07-30 DIAGNOSIS — E1169 Type 2 diabetes mellitus with other specified complication: Secondary | ICD-10-CM | POA: Diagnosis not present

## 2022-07-30 DIAGNOSIS — E782 Mixed hyperlipidemia: Secondary | ICD-10-CM | POA: Diagnosis not present

## 2022-08-04 DIAGNOSIS — E782 Mixed hyperlipidemia: Secondary | ICD-10-CM | POA: Diagnosis not present

## 2022-08-04 DIAGNOSIS — Z862 Personal history of diseases of the blood and blood-forming organs and certain disorders involving the immune mechanism: Secondary | ICD-10-CM | POA: Diagnosis not present

## 2022-08-04 DIAGNOSIS — I700 Atherosclerosis of aorta: Secondary | ICD-10-CM | POA: Diagnosis not present

## 2022-08-04 DIAGNOSIS — G252 Other specified forms of tremor: Secondary | ICD-10-CM | POA: Diagnosis not present

## 2022-08-04 DIAGNOSIS — E1169 Type 2 diabetes mellitus with other specified complication: Secondary | ICD-10-CM | POA: Diagnosis not present

## 2022-08-04 DIAGNOSIS — E785 Hyperlipidemia, unspecified: Secondary | ICD-10-CM | POA: Diagnosis not present

## 2022-08-04 DIAGNOSIS — Z0000 Encounter for general adult medical examination without abnormal findings: Secondary | ICD-10-CM | POA: Diagnosis not present

## 2022-09-02 DIAGNOSIS — H0015 Chalazion left lower eyelid: Secondary | ICD-10-CM | POA: Diagnosis not present

## 2022-09-03 ENCOUNTER — Other Ambulatory Visit: Payer: Self-pay | Admitting: Family Medicine

## 2022-09-03 DIAGNOSIS — I77810 Thoracic aortic ectasia: Secondary | ICD-10-CM

## 2022-09-03 DIAGNOSIS — I700 Atherosclerosis of aorta: Secondary | ICD-10-CM

## 2022-09-11 ENCOUNTER — Ambulatory Visit: Payer: PPO

## 2022-09-15 ENCOUNTER — Telehealth: Payer: Self-pay

## 2022-09-15 NOTE — Telephone Encounter (Signed)
Received a call from Amy at Dr. Sherryll Burger office. They had order CT chest wo due to patient has documented history in previous LDCT since 2021 showing had reached 15 years as non-smoker in 2023. To continue with a CT of the chest, the PCP had ordered the CT chest wo and completed the PA.  The patient is asking to change the order back to LDCT, as CT chest has out of pocket expenses, and patient states he believe his quit smoking date may have been entered wrong.  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 Due to our documented smoking history, we do not feel comfortable changing the history at this time but left VM for Amy at the office stating if her provider wanted to do a LDCT with different history date, they could do that but we do not feel we can change the date once the patient was released from our program.  Reminded we would not get the results if ordered under another provider so they would need to follow the patient if abnormal findings.  Discussed this with Abigail Miyamoto RN who also agreed with above information.

## 2022-09-18 ENCOUNTER — Other Ambulatory Visit: Payer: Self-pay | Admitting: Family Medicine

## 2022-09-18 DIAGNOSIS — Z87891 Personal history of nicotine dependence: Secondary | ICD-10-CM

## 2022-09-18 DIAGNOSIS — I77810 Thoracic aortic ectasia: Secondary | ICD-10-CM

## 2022-09-18 DIAGNOSIS — J438 Other emphysema: Secondary | ICD-10-CM

## 2022-09-18 DIAGNOSIS — Z122 Encounter for screening for malignant neoplasm of respiratory organs: Secondary | ICD-10-CM

## 2022-09-18 DIAGNOSIS — I700 Atherosclerosis of aorta: Secondary | ICD-10-CM

## 2022-10-01 ENCOUNTER — Ambulatory Visit
Admission: RE | Admit: 2022-10-01 | Discharge: 2022-10-01 | Disposition: A | Discharge status: Home / Self Care | Payer: PPO | Source: Ambulatory Visit | Attending: Family Medicine | Admitting: Family Medicine

## 2022-10-01 DIAGNOSIS — J438 Other emphysema: Secondary | ICD-10-CM | POA: Insufficient documentation

## 2022-10-01 DIAGNOSIS — I700 Atherosclerosis of aorta: Secondary | ICD-10-CM | POA: Insufficient documentation

## 2022-10-01 DIAGNOSIS — I77810 Thoracic aortic ectasia: Secondary | ICD-10-CM | POA: Diagnosis not present

## 2022-10-01 DIAGNOSIS — Z87891 Personal history of nicotine dependence: Secondary | ICD-10-CM | POA: Diagnosis not present

## 2022-10-01 DIAGNOSIS — Z122 Encounter for screening for malignant neoplasm of respiratory organs: Secondary | ICD-10-CM | POA: Insufficient documentation

## 2022-10-10 ENCOUNTER — Other Ambulatory Visit: Payer: PPO

## 2022-10-27 ENCOUNTER — Encounter (INDEPENDENT_AMBULATORY_CARE_PROVIDER_SITE_OTHER): Payer: Self-pay | Admitting: Vascular Surgery

## 2022-10-27 ENCOUNTER — Ambulatory Visit (INDEPENDENT_AMBULATORY_CARE_PROVIDER_SITE_OTHER): Payer: PPO | Admitting: Vascular Surgery

## 2022-10-27 VITALS — BP 147/92 | HR 71 | Resp 18 | Ht 68.0 in | Wt 176.0 lb

## 2022-10-27 DIAGNOSIS — I10 Essential (primary) hypertension: Secondary | ICD-10-CM

## 2022-10-27 DIAGNOSIS — I7121 Aneurysm of the ascending aorta, without rupture: Secondary | ICD-10-CM | POA: Diagnosis not present

## 2022-10-27 DIAGNOSIS — E119 Type 2 diabetes mellitus without complications: Secondary | ICD-10-CM | POA: Diagnosis not present

## 2022-10-27 DIAGNOSIS — I739 Peripheral vascular disease, unspecified: Secondary | ICD-10-CM | POA: Insufficient documentation

## 2022-10-27 DIAGNOSIS — E782 Mixed hyperlipidemia: Secondary | ICD-10-CM | POA: Diagnosis not present

## 2022-10-27 DIAGNOSIS — E785 Hyperlipidemia, unspecified: Secondary | ICD-10-CM | POA: Insufficient documentation

## 2022-10-27 DIAGNOSIS — I2510 Atherosclerotic heart disease of native coronary artery without angina pectoris: Secondary | ICD-10-CM | POA: Diagnosis not present

## 2022-10-27 NOTE — Progress Notes (Signed)
MRN : [id number]  David Mccoy is a 72 y.o. (05-20-1950) male who presents with chief complaint of check circulation.  History of Present Illness:   The patient presents to the office for evaluation of an ascending thoracic aortic aneurysm. The aneurysm was found incidentally by CT scan. Patient denies chest pain or unusual back pain, no other chest or abdominal complaints.  No history of an abrupt onset of a painful toe associated with blue discoloration.     No family history of TAA/AAA.   Patient denies amaurosis fugax or TIA symptoms.  There is no history of claudication or rest pain symptoms of the lower extremities.   The patient denies angina or shortness of breath.  CT scan of the chest dated 10/01/2022, shows an ascending TAA that measures 4.5 cm.  This is reviewed by me personally and with the patient.  In addition the CT scan of the pelvis dated 02/13/2020 as well as the CT scan of the abdomen and pelvis with contrast dated 07/22/2016 are reviewed by me personally and with the patient.  Current Meds  Medication Sig   aspirin 81 MG EC tablet Take 81 mg by mouth daily.   atorvastatin (LIPITOR) 40 MG tablet Take 1 tablet by mouth at bedtime.   Calcium Carb-Cholecalciferol (CALCIUM 1000 + D PO) Take 1 tablet by mouth daily.   cholecalciferol (VITAMIN D) 1000 units tablet Take 1,000 Units by mouth daily.   ferrous sulfate 324 (65 Fe) MG TBEC Take 65 mg by mouth in the morning and at bedtime.   fluticasone (FLONASE) 50 MCG/ACT nasal spray Place 2 sprays into both nostrils daily.   lansoprazole (PREVACID) 15 MG capsule Take 15 mg by mouth daily.   lisinopril (ZESTRIL) 2.5 MG tablet Take 2.5 mg by mouth daily.   metFORMIN (GLUCOPHAGE-XR) 500 MG 24 hr tablet Take 500 mg by mouth daily.   Multiple Vitamin (MULTIVITAMIN) tablet Take 1 tablet by mouth daily.   Omega-3 Fatty Acids (FISH OIL) 1000 MG CAPS Take  1,000 mg by mouth daily.   vitamin B-12 (CYANOCOBALAMIN) 1000 MCG tablet Take 1,000 mcg by mouth daily.    Past Medical History:  Diagnosis Date   Colon polyp 2008   Diverticulosis    Emphysema lung (HCC)    GERD (gastroesophageal reflux disease)    Gilbert syndrome    Gout    Gout    Hyperlipidemia    Peptic ulcer disease    Pre-diabetes    Prostate CA (HCC) 09/2015   Tubular adenoma of colon 2016    Past Surgical History:  Procedure Laterality Date   COLONOSCOPY     2008, 2011, 2016   COLONOSCOPY WITH PROPOFOL N/A 04/06/2020   Procedure: COLONOSCOPY WITH PROPOFOL;  Surgeon: Regis Bill, MD;  Location: [facility] ENDOSCOPY;  Service: Endoscopy;  Laterality: N/A;   HERNIA REPAIR     ROBOTIC LAPAROSCOPY PROSTECTOMY  2017   AND INGUINAL HERNIA REPAIR   TONSILLECTOMY     XI ROBOTIC ASSISTED INGUINAL HERNIA REPAIR WITH MESH Left 02/20/2020   Procedure: XI ROBOTIC ASSISTED INGUINAL HERNIA REPAIR WITH  MESH;  Surgeon: Carolan Shiver, MD;  Location: [facility] ORS;  Service: General;  Laterality: Left;    Social History Social History   Tobacco Use   Smoking status: Former   Smokeless tobacco: Former  Building services engineer status: Never Used  Substance Use Topics   Alcohol use: Yes    Alcohol/week: 2.0 standard drinks of alcohol    Types: 2 Glasses of wine per week    Comment: OR 2 BEERS   Drug use: No    Family History Family History  Problem Relation Age of Onset   Bladder Cancer Neg Hx    Kidney cancer Neg Hx    Prostate cancer Neg Hx     No Known Allergies   REVIEW OF SYSTEMS (Negative unless checked)  Constitutional: [] Weight loss  [] Fever  [] Chills Cardiac: [] Chest pain   [] Chest pressure   [] Palpitations   [] Shortness of breath when laying flat   [] Shortness of breath with exertion. Vascular:  [x] Pain in legs with walking   [] Pain in legs at rest  [] History of DVT   [] Phlebitis   [] Swelling in legs   [] Varicose veins   [] Non-healing ulcers Pulmonary:    [] Uses home oxygen   [] Productive cough   [] Hemoptysis   [] Wheeze  [] COPD   [] Asthma Neurologic:  [] Dizziness   [] Seizures   [] History of stroke   [] History of TIA  [] Aphasia   [] Vissual changes   [] Weakness or numbness in arm   [] Weakness or numbness in leg Musculoskeletal:   [] Joint swelling   [] Joint pain   [] Low back pain Hematologic:  [] Easy bruising  [] Easy bleeding   [] Hypercoagulable state   [] Anemic Gastrointestinal:  [] Diarrhea   [] Vomiting  [] Gastroesophageal reflux/heartburn   [] Difficulty swallowing. Genitourinary:  [] Chronic kidney disease   [] Difficult urination  [] Frequent urination   [] Blood in urine Skin:  [] Rashes   [] Ulcers  Psychological:  [] History of anxiety   []  History of major depression.  Physical Examination  Vitals:   10/27/22 1351  BP: (!) 147/92  Pulse: 71  Resp: 18  Weight: 176 lb (79.8 kg)  Height: 5\' 8"  (1.727 m)   Body mass index is 26.76 kg/m. Gen: WD/WN, NAD Head: [place]/AT, No temporalis wasting.  Ear/Nose/Throat: Hearing grossly intact, nares w/o erythema or drainage Eyes: PER, EOMI, sclera nonicteric.  Neck: Supple, no masses.  No bruit or JVD.  Pulmonary:  Good air movement, no audible wheezing, no use of accessory muscles.  Cardiac: RRR, normal S1, S2, no Murmurs. Vascular:  mild trophic changes, no open wounds Vessel Right Left  Radial Palpable Palpable  PT Palpable  Palpable  DP  Palpable Palpable  Gastrointestinal: soft, non-distended. No guarding/no peritoneal signs.  Musculoskeletal: M/S 5/5 throughout.  No visible deformity.  Neurologic: CN 2-12 intact. Pain and light touch intact in extremities.  Symmetrical.  Speech is fluent. Motor exam as listed above. Psychiatric: Judgment intact, Mood & affect appropriate for pt's clinical situation. Dermatologic: No rashes or ulcers noted.  No changes consistent with cellulitis.   CBC No results found for: "WBC", "HGB", "HCT", "MCV", "PLT"  BMET No results found for: "NA", "K", "CL",  "CO2", "GLUCOSE", "BUN", "CREATININE", "CALCIUM", "GFRNONAA", "GFRAA" CrCl cannot be calculated (No successful lab value found.).  COAG No results found for: "INR", "PROTIME"  Radiology CT CHEST LUNG CA SCREEN LOW DOSE W/O CM  Result Date: 10/07/2022 CLINICAL DATA:  72 year old male former smoker (quit in 2013) with 37 pack-year history of smoking. Lung cancer screening examination. EXAM:  CT CHEST WITHOUT CONTRAST LOW-DOSE FOR LUNG CANCER SCREENING TECHNIQUE: Multidetector CT imaging of the chest was performed following the standard protocol without IV contrast. RADIATION DOSE REDUCTION: This exam was performed according to the departmental dose-optimization program which includes automated exposure control, adjustment of the mA and/or kV according to patient size and/or use of iterative reconstruction technique. COMPARISON:  Low-dose lung cancer screening chest CT 10/07/2021. FINDINGS: Cardiovascular: Heart size is normal. There is no significant pericardial fluid, thickening or pericardial calcification. There is aortic atherosclerosis, as well as atherosclerosis of the great vessels of the mediastinum and the coronary arteries, including calcified atherosclerotic plaque in the left anterior descending coronary artery. Ectasia of ascending thoracic aorta (4.4 cm in diameter). Mediastinum/Nodes: No pathologically enlarged mediastinal or hilar lymph nodes. Please note that accurate exclusion of hilar adenopathy is limited on noncontrast CT scans. Esophagus is unremarkable in appearance. No axillary lymphadenopathy. Lungs/Pleura: Multiple tiny pulmonary nodules are again noted in the lungs bilaterally, largest of which is in the medial aspect of the right lower lobe posteriorly (axial image 181), with a volume derived mean diameter of 3.2 mm. No larger more suspicious appearing pulmonary nodules or masses are noted. No acute consolidative airspace disease. No pleural effusions. Mild diffuse bronchial wall  thickening with mild centrilobular and paraseptal emphysema. Upper Abdomen: Aortic atherosclerosis. Nonobstructive calculi in the collecting systems of both kidneys measuring up to 5 mm in the left renal collecting system. Musculoskeletal: There are no aggressive appearing lytic or blastic lesions noted in the visualized portions of the skeleton. IMPRESSION: 1. Lung-RADS 2S, benign appearance or behavior. Continue annual screening with low-dose chest CT without contrast in 12 months. 2. The "S" modifier above refers to potentially clinically significant non lung cancer related findings. Specifically, there is aortic atherosclerosis, in addition to left anterior descending coronary artery disease. Please note that although the presence of coronary artery calcium documents the presence of coronary artery disease, the severity of this disease and any potential stenosis cannot be assessed on this non-gated CT examination. Assessment for potential risk factor modification, dietary therapy or pharmacologic therapy may be warranted, if clinically indicated. 3. There is also ectasia of the ascending thoracic aorta (4.4 cm in diameter). Continued attention at time of repeat annual low-dose lung cancer screening chest CT is recommended. 4. Mild diffuse bronchial wall thickening with mild centrilobular and paraseptal emphysema; imaging findings suggestive of underlying COPD. 5. Bilateral nephrolithiasis measuring up to 5 mm in the left renal collecting system. Aortic Atherosclerosis (ICD10-I70.0) and Emphysema (ICD10-J43.9). Electronically Signed   By: Trudie Reed M.D.   On: 10/07/2022 08:50     Assessment/Plan 1. Aneurysm of ascending aorta without rupture (HCC) Recommend:  No surgery or intervention is indicated at this time.  The patient has an asymptomatic thoracic aortic aneurysm that is less than 6.0 cm in maximal diameter.  I have discussed the natural history of thoracic aortic aneurysm and the small risk  of rupture for aneurysm less than 6.5 cm in size.  However, as these small aneurysms tend to enlarge over time, continued surveillance with CT scan is mandatory.  As noted above we also reviewed prior CT scans to exclude abdominal aortic aneurysm.  No evidence for aneurysm other than the ascending portion of his aorta is identified.  I have also discussed optimizing medical management with hypertension and lipid control and the importance of abstinence from tobacco.  The patient is also encouraged to exercise a minimum of 30 minutes 4 times a week.  Should the patient develop new onset chest or back pain or signs of peripheral embolization they are instructed to seek medical attention immediately and to alert the physician providing care that they have an aneurysm in the chest.   The patient voices their understanding.  The patient will return as ordered with a CT scan of the chest.  We have discussed that the CT screening for lung cancer is more than adequate and that he should arrange his follow-up with me after his annual screening exam and I could use that to review his ascending aneurysm.  2. PAD (peripheral artery disease) (HCC) Recommend:  I do not find evidence of life style limiting vascular disease. The patient specifically denies life style limitation.  Previous noninvasive studies including ABI's of the legs do not identify critical vascular problems.  The patient should continue walking and begin a more formal exercise program. The patient should continue his antiplatelet therapy and aggressive treatment of the lipid abnormalities.  The patient is instructed to call the office if there is a significant change in the lower extremity symptoms, particularly if a wound develops or there is an abrupt increase in leg pain.  3. Essential hypertension Continue antihypertensive medications as already ordered, these medications have been reviewed and there are no changes at this  time.  4. Mixed hyperlipidemia Continue statin as ordered and reviewed, no changes at this time  5. Type 2 diabetes mellitus without complication, without long-term current use of insulin (HCC) Continue hypoglycemic medications as already ordered, these medications have been reviewed and there are no changes at this time.  Hgb A1C to be monitored as already arranged by primary service    Levora Dredge, MD  10/27/2022 8:38 PM

## 2022-11-04 DIAGNOSIS — I2510 Atherosclerotic heart disease of native coronary artery without angina pectoris: Secondary | ICD-10-CM | POA: Diagnosis not present

## 2022-12-08 DIAGNOSIS — E782 Mixed hyperlipidemia: Secondary | ICD-10-CM | POA: Diagnosis not present

## 2022-12-09 DIAGNOSIS — L718 Other rosacea: Secondary | ICD-10-CM | POA: Diagnosis not present

## 2022-12-09 DIAGNOSIS — D2262 Melanocytic nevi of left upper limb, including shoulder: Secondary | ICD-10-CM | POA: Diagnosis not present

## 2022-12-09 DIAGNOSIS — D485 Neoplasm of uncertain behavior of skin: Secondary | ICD-10-CM | POA: Diagnosis not present

## 2022-12-09 DIAGNOSIS — D2261 Melanocytic nevi of right upper limb, including shoulder: Secondary | ICD-10-CM | POA: Diagnosis not present

## 2022-12-09 DIAGNOSIS — L821 Other seborrheic keratosis: Secondary | ICD-10-CM | POA: Diagnosis not present

## 2022-12-09 DIAGNOSIS — D2271 Melanocytic nevi of right lower limb, including hip: Secondary | ICD-10-CM | POA: Diagnosis not present

## 2022-12-09 DIAGNOSIS — L820 Inflamed seborrheic keratosis: Secondary | ICD-10-CM | POA: Diagnosis not present

## 2022-12-09 DIAGNOSIS — D2272 Melanocytic nevi of left lower limb, including hip: Secondary | ICD-10-CM | POA: Diagnosis not present

## 2022-12-09 DIAGNOSIS — L570 Actinic keratosis: Secondary | ICD-10-CM | POA: Diagnosis not present

## 2023-01-06 DIAGNOSIS — Z13228 Encounter for screening for other metabolic disorders: Secondary | ICD-10-CM | POA: Diagnosis not present

## 2023-01-27 DIAGNOSIS — D508 Other iron deficiency anemias: Secondary | ICD-10-CM | POA: Diagnosis not present

## 2023-01-27 DIAGNOSIS — Z125 Encounter for screening for malignant neoplasm of prostate: Secondary | ICD-10-CM | POA: Diagnosis not present

## 2023-01-27 DIAGNOSIS — E785 Hyperlipidemia, unspecified: Secondary | ICD-10-CM | POA: Diagnosis not present

## 2023-01-27 DIAGNOSIS — E782 Mixed hyperlipidemia: Secondary | ICD-10-CM | POA: Diagnosis not present

## 2023-01-27 DIAGNOSIS — E1169 Type 2 diabetes mellitus with other specified complication: Secondary | ICD-10-CM | POA: Diagnosis not present

## 2023-01-27 DIAGNOSIS — Z862 Personal history of diseases of the blood and blood-forming organs and certain disorders involving the immune mechanism: Secondary | ICD-10-CM | POA: Diagnosis not present

## 2023-02-03 DIAGNOSIS — Z8546 Personal history of malignant neoplasm of prostate: Secondary | ICD-10-CM | POA: Diagnosis not present

## 2023-02-03 DIAGNOSIS — E782 Mixed hyperlipidemia: Secondary | ICD-10-CM | POA: Diagnosis not present

## 2023-02-03 DIAGNOSIS — E785 Hyperlipidemia, unspecified: Secondary | ICD-10-CM | POA: Diagnosis not present

## 2023-02-03 DIAGNOSIS — D508 Other iron deficiency anemias: Secondary | ICD-10-CM | POA: Diagnosis not present

## 2023-02-03 DIAGNOSIS — E1169 Type 2 diabetes mellitus with other specified complication: Secondary | ICD-10-CM | POA: Diagnosis not present

## 2023-03-11 DIAGNOSIS — M778 Other enthesopathies, not elsewhere classified: Secondary | ICD-10-CM | POA: Diagnosis not present

## 2023-06-08 DIAGNOSIS — H25013 Cortical age-related cataract, bilateral: Secondary | ICD-10-CM | POA: Diagnosis not present

## 2023-06-08 DIAGNOSIS — E119 Type 2 diabetes mellitus without complications: Secondary | ICD-10-CM | POA: Diagnosis not present

## 2023-06-08 DIAGNOSIS — H43811 Vitreous degeneration, right eye: Secondary | ICD-10-CM | POA: Diagnosis not present

## 2023-06-08 DIAGNOSIS — H43812 Vitreous degeneration, left eye: Secondary | ICD-10-CM | POA: Diagnosis not present

## 2023-06-08 DIAGNOSIS — H353131 Nonexudative age-related macular degeneration, bilateral, early dry stage: Secondary | ICD-10-CM | POA: Diagnosis not present

## 2023-06-25 DIAGNOSIS — Z860101 Personal history of adenomatous and serrated colon polyps: Secondary | ICD-10-CM | POA: Diagnosis not present

## 2023-06-25 DIAGNOSIS — Z01818 Encounter for other preprocedural examination: Secondary | ICD-10-CM | POA: Diagnosis not present

## 2023-07-01 ENCOUNTER — Other Ambulatory Visit: Payer: Self-pay | Admitting: Family Medicine

## 2023-07-01 DIAGNOSIS — Z9189 Other specified personal risk factors, not elsewhere classified: Secondary | ICD-10-CM

## 2023-07-07 ENCOUNTER — Encounter (INDEPENDENT_AMBULATORY_CARE_PROVIDER_SITE_OTHER): Payer: Self-pay

## 2023-07-08 ENCOUNTER — Ambulatory Visit
Admission: RE | Admit: 2023-07-08 | Discharge: 2023-07-08 | Disposition: A | Discharge status: Home / Self Care | Payer: Self-pay | Source: Ambulatory Visit | Attending: Family Medicine | Admitting: Family Medicine

## 2023-07-08 DIAGNOSIS — Z9189 Other specified personal risk factors, not elsewhere classified: Secondary | ICD-10-CM | POA: Insufficient documentation

## 2023-07-14 ENCOUNTER — Ambulatory Visit: Payer: Self-pay

## 2023-07-14 DIAGNOSIS — D123 Benign neoplasm of transverse colon: Secondary | ICD-10-CM | POA: Diagnosis not present

## 2023-07-14 DIAGNOSIS — K641 Second degree hemorrhoids: Secondary | ICD-10-CM | POA: Diagnosis not present

## 2023-07-14 DIAGNOSIS — D126 Benign neoplasm of colon, unspecified: Secondary | ICD-10-CM | POA: Diagnosis not present

## 2023-07-14 DIAGNOSIS — Z860101 Personal history of adenomatous and serrated colon polyps: Secondary | ICD-10-CM | POA: Diagnosis not present

## 2023-07-14 DIAGNOSIS — Z1211 Encounter for screening for malignant neoplasm of colon: Secondary | ICD-10-CM | POA: Diagnosis not present

## 2023-07-14 DIAGNOSIS — K5730 Diverticulosis of large intestine without perforation or abscess without bleeding: Secondary | ICD-10-CM | POA: Diagnosis not present

## 2023-07-14 DIAGNOSIS — Z860100 Personal history of colon polyps, unspecified: Secondary | ICD-10-CM | POA: Diagnosis not present

## 2023-07-30 DIAGNOSIS — D508 Other iron deficiency anemias: Secondary | ICD-10-CM | POA: Diagnosis not present

## 2023-07-30 DIAGNOSIS — E785 Hyperlipidemia, unspecified: Secondary | ICD-10-CM | POA: Diagnosis not present

## 2023-07-30 DIAGNOSIS — E782 Mixed hyperlipidemia: Secondary | ICD-10-CM | POA: Diagnosis not present

## 2023-07-30 DIAGNOSIS — E1169 Type 2 diabetes mellitus with other specified complication: Secondary | ICD-10-CM | POA: Diagnosis not present

## 2023-08-06 DIAGNOSIS — E782 Mixed hyperlipidemia: Secondary | ICD-10-CM | POA: Diagnosis not present

## 2023-08-06 DIAGNOSIS — D508 Other iron deficiency anemias: Secondary | ICD-10-CM | POA: Diagnosis not present

## 2023-08-06 DIAGNOSIS — Z0000 Encounter for general adult medical examination without abnormal findings: Secondary | ICD-10-CM | POA: Diagnosis not present

## 2023-08-06 DIAGNOSIS — E785 Hyperlipidemia, unspecified: Secondary | ICD-10-CM | POA: Diagnosis not present

## 2023-08-06 DIAGNOSIS — Z1331 Encounter for screening for depression: Secondary | ICD-10-CM | POA: Diagnosis not present

## 2023-08-06 DIAGNOSIS — E1169 Type 2 diabetes mellitus with other specified complication: Secondary | ICD-10-CM | POA: Diagnosis not present

## 2023-08-06 DIAGNOSIS — Z8546 Personal history of malignant neoplasm of prostate: Secondary | ICD-10-CM | POA: Diagnosis not present

## 2023-08-13 DIAGNOSIS — L718 Other rosacea: Secondary | ICD-10-CM | POA: Diagnosis not present

## 2023-08-13 DIAGNOSIS — L538 Other specified erythematous conditions: Secondary | ICD-10-CM | POA: Diagnosis not present

## 2023-08-13 DIAGNOSIS — L820 Inflamed seborrheic keratosis: Secondary | ICD-10-CM | POA: Diagnosis not present

## 2023-11-30 ENCOUNTER — Encounter (INDEPENDENT_AMBULATORY_CARE_PROVIDER_SITE_OTHER): Payer: Self-pay | Admitting: Vascular Surgery

## 2023-11-30 ENCOUNTER — Ambulatory Visit (INDEPENDENT_AMBULATORY_CARE_PROVIDER_SITE_OTHER): Payer: BLUE CROSS/BLUE SHIELD | Admitting: Vascular Surgery

## 2023-11-30 VITALS — BP 127/80 | HR 65 | Ht 68.0 in | Wt 180.2 lb

## 2023-11-30 DIAGNOSIS — E119 Type 2 diabetes mellitus without complications: Secondary | ICD-10-CM | POA: Diagnosis not present

## 2023-11-30 DIAGNOSIS — I7121 Aneurysm of the ascending aorta, without rupture: Secondary | ICD-10-CM

## 2023-11-30 DIAGNOSIS — I10 Essential (primary) hypertension: Secondary | ICD-10-CM

## 2023-11-30 DIAGNOSIS — E782 Mixed hyperlipidemia: Secondary | ICD-10-CM

## 2023-11-30 DIAGNOSIS — I739 Peripheral vascular disease, unspecified: Secondary | ICD-10-CM

## 2023-12-08 ENCOUNTER — Encounter (INDEPENDENT_AMBULATORY_CARE_PROVIDER_SITE_OTHER): Payer: Self-pay | Admitting: Vascular Surgery

## 2023-12-08 NOTE — Progress Notes (Signed)
 MRN : [id number]  David Mccoy is a 73 y.o. (February 10, 1951) male who presents with chief complaint of check circulation.  History of Present Illness:   The patient presents to the office for evaluation of an ascending thoracic aortic aneurysm. The aneurysm was found incidentally by CT scan. Patient denies chest pain or unusual back pain, no other chest or abdominal complaints.  No history of an abrupt onset of a painful toe associated with blue discoloration.     No family history of TAA/AAA.   Patient denies amaurosis fugax or TIA symptoms.  There is no history of claudication or rest pain symptoms of the lower extremities.   The patient denies angina or shortness of breath.  CT scan dated 08/12/2023, shows an ascending TAA that measures 4.2 cm   Current Meds  Medication Sig   aspirin 81 MG EC tablet Take 81 mg by mouth daily.   atorvastatin (LIPITOR) 40 MG tablet Take 1 tablet by mouth at bedtime.   Calcium Carb-Cholecalciferol (CALCIUM 1000 + D PO) Take 1 tablet by mouth daily.   cholecalciferol (VITAMIN D) 1000 units tablet Take 1,000 Units by mouth daily.   ferrous sulfate 324 (65 Fe) MG TBEC Take 65 mg by mouth in the morning and at bedtime.   fluticasone (FLONASE) 50 MCG/ACT nasal spray Place 2 sprays into both nostrils daily.   lansoprazole (PREVACID) 15 MG capsule Take 15 mg by mouth daily.   lisinopril (ZESTRIL) 2.5 MG tablet Take 2.5 mg by mouth daily.   metFORMIN (GLUCOPHAGE-XR) 500 MG 24 hr tablet Take 500 mg by mouth daily.   Multiple Vitamin (MULTIVITAMIN) tablet Take 1 tablet by mouth daily.   vitamin B-12 (CYANOCOBALAMIN) 1000 MCG tablet Take 1,000 mcg by mouth daily.    Past Medical History:  Diagnosis Date   Colon polyp 2008   Diverticulosis    Emphysema lung (HCC)    GERD (gastroesophageal reflux disease)    Gilbert syndrome    Gout    Gout    Hyperlipidemia    Peptic ulcer  disease    Pre-diabetes    Prostate CA (HCC) 09/2015   Tubular adenoma of colon 2016    Past Surgical History:  Procedure Laterality Date   COLONOSCOPY     2008, 2011, 2016   COLONOSCOPY WITH PROPOFOL  N/A 04/06/2020   Procedure: COLONOSCOPY WITH PROPOFOL ;  Surgeon: Maryruth Ole DASEN, MD;  Location: [facility];  Service: Endoscopy;  Laterality: N/A;   HERNIA REPAIR     ROBOTIC LAPAROSCOPY PROSTECTOMY  2017   AND INGUINAL HERNIA REPAIR   TONSILLECTOMY     XI ROBOTIC ASSISTED INGUINAL HERNIA REPAIR WITH MESH Left 02/20/2020   Procedure: XI ROBOTIC ASSISTED INGUINAL HERNIA REPAIR WITH MESH;  Surgeon: Rodolph Romano, MD;  Location: [facility];  Service: General;  Laterality: Left;    Social History Social History   Tobacco Use   Smoking status: Former   Smokeless tobacco: Former  Building services engineer status: Never Used  Substance Use Topics   Alcohol use: Yes    Alcohol/week: 2.0 standard drinks of alcohol  Types: 2 Glasses of wine per week    Comment: OR 2 BEERS   Drug use: No    Family History Family History  Problem Relation Age of Onset   Bladder Cancer Neg Hx    Kidney cancer Neg Hx    Prostate cancer Neg Hx     No Known Allergies   REVIEW OF SYSTEMS (Negative unless checked)  Constitutional: [] Weight loss  [] Fever  [] Chills Cardiac: [] Chest pain   [] Chest pressure   [] Palpitations   [] Shortness of breath when laying flat   [] Shortness of breath with exertion. Vascular:  [x] Pain in legs with walking   [] Pain in legs at rest  [] History of DVT   [] Phlebitis   [] Swelling in legs   [] Varicose veins   [] Non-healing ulcers Pulmonary:   [] Uses home oxygen   [] Productive cough   [] Hemoptysis   [] Wheeze  [] COPD   [] Asthma Neurologic:  [] Dizziness   [] Seizures   [] History of stroke   [] History of TIA  [] Aphasia   [] Vissual changes   [] Weakness or numbness in arm   [] Weakness or numbness in leg Musculoskeletal:   [] Joint swelling   [] Joint pain   [] Low back  pain Hematologic:  [] Easy bruising  [] Easy bleeding   [] Hypercoagulable state   [] Anemic Gastrointestinal:  [] Diarrhea   [] Vomiting  [] Gastroesophageal reflux/heartburn   [] Difficulty swallowing. Genitourinary:  [] Chronic kidney disease   [] Difficult urination  [] Frequent urination   [] Blood in urine Skin:  [] Rashes   [] Ulcers  Psychological:  [] History of anxiety   []  History of major depression.  Physical Examination  Vitals:   11/30/23 1115  BP: 127/80  Pulse: 65  Weight: 180 lb 3.2 oz (81.7 kg)  Height: 5' 8 (1.727 m)   Body mass index is 27.4 kg/m. Gen: WD/WN, NAD Head: [place]/AT, No temporalis wasting.  Ear/Nose/Throat: Hearing grossly intact, nares w/o erythema or drainage Eyes: PER, EOMI, sclera nonicteric.  Neck: Supple, no masses.  No bruit or JVD.  Pulmonary:  Good air movement, no audible wheezing, no use of accessory muscles.  Cardiac: RRR, normal S1, S2, no Murmurs. Vascular:  mild trophic changes, no open wounds Vessel Right Left  Radial Palpable Palpable  Gastrointestinal: soft, non-distended. No guarding/no peritoneal signs.  Musculoskeletal: M/S 5/5 throughout.  No visible deformity.  Neurologic: CN 2-12 intact. Pain and light touch intact in extremities.  Symmetrical.  Speech is fluent. Motor exam as listed above. Psychiatric: Judgment intact, Mood & affect appropriate for pt's clinical situation. Dermatologic: No rashes or ulcers noted.  No changes consistent with cellulitis.   CBC No results found for: WBC, HGB, HCT, MCV, PLT  BMET No results found for: NA, K, CL, CO2, GLUCOSE, BUN, CREATININE, CALCIUM, GFRNONAA, GFRAA CrCl cannot be calculated (No successful lab value found.).  COAG No results found for: INR, PROTIME  Radiology No results found.   Assessment/Plan 1. Aneurysm of ascending aorta without rupture (Primary) Recommend:  No surgery or intervention is indicated at this time.  The patient has an  asymptomatic thoracic aortic aneurysm that is less than 6.0 cm in maximal diameter.  I have discussed the natural history of thoracic aortic aneurysm and the small risk of rupture for aneurysm less than 6.5 cm in size.  However, as these small aneurysms tend to enlarge over time, continued surveillance with CT scan is mandatory.   I have also discussed optimizing medical management with hypertension and lipid control and the negative effect that any tobacco products have on aneurysmal disease.  The patient is also encouraged to exercise a minimum of 30 minutes 4 times a week.   Should the patient develop new onset chest or back pain or signs of peripheral embolization they are instructed to seek medical attention immediately and to alert the physician providing care that they have an aneurysm in the chest.   The patient voices their understanding.  The patient will return as ordered with a CT scan of the chest  2. PAD (peripheral artery disease) Recommend:  I do not find evidence of life style limiting vascular disease. The patient specifically denies life style limitation.  Previous noninvasive studies including ABI's of the legs do not identify critical vascular problems.  The patient should continue walking and begin a more formal exercise program. The patient should continue his antiplatelet therapy and aggressive treatment of the lipid abnormalities.  The patient is instructed to call the office if there is a significant change in the lower extremity symptoms, particularly if a wound develops or there is an abrupt increase in leg pain.  3. Essential hypertension Continue antihypertensive medications as already ordered, these medications have been reviewed and there are no changes at this time.  4. Type 2 diabetes mellitus without complication, without long-term current use of insulin (HCC) Continue hypoglycemic medications as already ordered, these medications have been reviewed and  there are no changes at this time.  Hgb A1C to be monitored as already arranged by primary service  5. Mixed hyperlipidemia Continue statin as ordered and reviewed, no changes at this time    Cordella Shawl, MD  12/08/2023 12:59 PM

## 2023-12-10 DIAGNOSIS — D2261 Melanocytic nevi of right upper limb, including shoulder: Secondary | ICD-10-CM | POA: Diagnosis not present

## 2023-12-10 DIAGNOSIS — D225 Melanocytic nevi of trunk: Secondary | ICD-10-CM | POA: Diagnosis not present

## 2023-12-10 DIAGNOSIS — D2262 Melanocytic nevi of left upper limb, including shoulder: Secondary | ICD-10-CM | POA: Diagnosis not present

## 2023-12-10 DIAGNOSIS — L400 Psoriasis vulgaris: Secondary | ICD-10-CM | POA: Diagnosis not present

## 2023-12-10 DIAGNOSIS — L718 Other rosacea: Secondary | ICD-10-CM | POA: Diagnosis not present

## 2023-12-10 DIAGNOSIS — D2271 Melanocytic nevi of right lower limb, including hip: Secondary | ICD-10-CM | POA: Diagnosis not present

## 2023-12-10 DIAGNOSIS — L570 Actinic keratosis: Secondary | ICD-10-CM | POA: Diagnosis not present

## 2023-12-10 DIAGNOSIS — L821 Other seborrheic keratosis: Secondary | ICD-10-CM | POA: Diagnosis not present

## 2023-12-10 DIAGNOSIS — D2272 Melanocytic nevi of left lower limb, including hip: Secondary | ICD-10-CM | POA: Diagnosis not present
